# Patient Record
Sex: Female | Born: 1935 | Race: White | Hispanic: No | Smoking: Never smoker
Health system: Southern US, Community
[De-identification: ages and names within clinical notes are randomized; demographics above are authoritative.]

## PROBLEM LIST (undated history)

## (undated) DIAGNOSIS — D649 Anemia, unspecified: Secondary | ICD-10-CM

## (undated) DIAGNOSIS — I10 Essential (primary) hypertension: Secondary | ICD-10-CM

## (undated) DIAGNOSIS — H409 Unspecified glaucoma: Secondary | ICD-10-CM

## (undated) DIAGNOSIS — S8011XA Contusion of right lower leg, initial encounter: Secondary | ICD-10-CM

## (undated) DIAGNOSIS — L03115 Cellulitis of right lower limb: Secondary | ICD-10-CM

## (undated) DIAGNOSIS — H547 Unspecified visual loss: Secondary | ICD-10-CM

## (undated) DIAGNOSIS — R531 Weakness: Secondary | ICD-10-CM

## (undated) HISTORY — PX: CYST REMOVAL NECK: SHX6281

## (undated) HISTORY — DX: Unspecified visual loss: H54.7

## (undated) HISTORY — DX: Weakness: R53.1

## (undated) HISTORY — DX: Essential (primary) hypertension: I10

## (undated) HISTORY — DX: Cellulitis of right lower limb: L03.115

## (undated) HISTORY — DX: Unspecified glaucoma: H40.9

## (undated) HISTORY — DX: Contusion of right lower leg, initial encounter: S80.11XA

## (undated) HISTORY — DX: Anemia, unspecified: D64.9

---

## 2015-02-12 ENCOUNTER — Inpatient Hospital Stay (HOSPITAL_COMMUNITY)
Admission: EM | Admit: 2015-02-12 | Discharge: 2015-02-16 | DRG: 603 | Disposition: A | Discharge status: Skilled Nursing Facility | Payer: Medicare Other | Attending: Internal Medicine | Admitting: Internal Medicine

## 2015-02-12 ENCOUNTER — Emergency Department (HOSPITAL_COMMUNITY): Payer: Medicare Other

## 2015-02-12 ENCOUNTER — Encounter (HOSPITAL_COMMUNITY): Payer: Self-pay | Admitting: Emergency Medicine

## 2015-02-12 DIAGNOSIS — I10 Essential (primary) hypertension: Secondary | ICD-10-CM | POA: Diagnosis present

## 2015-02-12 DIAGNOSIS — Z79899 Other long term (current) drug therapy: Secondary | ICD-10-CM

## 2015-02-12 DIAGNOSIS — E871 Hypo-osmolality and hyponatremia: Secondary | ICD-10-CM | POA: Diagnosis present

## 2015-02-12 DIAGNOSIS — I878 Other specified disorders of veins: Secondary | ICD-10-CM | POA: Diagnosis present

## 2015-02-12 DIAGNOSIS — S81801A Unspecified open wound, right lower leg, initial encounter: Secondary | ICD-10-CM | POA: Diagnosis present

## 2015-02-12 DIAGNOSIS — H409 Unspecified glaucoma: Secondary | ICD-10-CM | POA: Diagnosis present

## 2015-02-12 DIAGNOSIS — H540 Blindness, both eyes: Secondary | ICD-10-CM | POA: Diagnosis present

## 2015-02-12 DIAGNOSIS — L03115 Cellulitis of right lower limb: Principal | ICD-10-CM

## 2015-02-12 DIAGNOSIS — L0390 Cellulitis, unspecified: Secondary | ICD-10-CM

## 2015-02-12 DIAGNOSIS — E876 Hypokalemia: Secondary | ICD-10-CM | POA: Diagnosis present

## 2015-02-12 DIAGNOSIS — R609 Edema, unspecified: Secondary | ICD-10-CM

## 2015-02-12 DIAGNOSIS — D649 Anemia, unspecified: Secondary | ICD-10-CM | POA: Diagnosis present

## 2015-02-12 DIAGNOSIS — Z66 Do not resuscitate: Secondary | ICD-10-CM | POA: Diagnosis present

## 2015-02-12 DIAGNOSIS — W19XXXA Unspecified fall, initial encounter: Secondary | ICD-10-CM | POA: Diagnosis present

## 2015-02-12 LAB — COMPREHENSIVE METABOLIC PANEL
ALT: 18 U/L (ref 14–54)
AST: 20 U/L (ref 15–41)
Albumin: 3.6 g/dL (ref 3.5–5.0)
Alkaline Phosphatase: 61 U/L (ref 38–126)
Anion gap: 11 (ref 5–15)
BILIRUBIN TOTAL: 0.5 mg/dL (ref 0.3–1.2)
BUN: 25 mg/dL — AB (ref 6–20)
CO2: 26 mmol/L (ref 22–32)
CREATININE: 0.62 mg/dL (ref 0.44–1.00)
Calcium: 9.4 mg/dL (ref 8.9–10.3)
Chloride: 97 mmol/L — ABNORMAL LOW (ref 101–111)
Glucose, Bld: 133 mg/dL — ABNORMAL HIGH (ref 65–99)
Potassium: 4 mmol/L (ref 3.5–5.1)
Sodium: 134 mmol/L — ABNORMAL LOW (ref 135–145)
TOTAL PROTEIN: 6.7 g/dL (ref 6.5–8.1)

## 2015-02-12 LAB — CBC WITH DIFFERENTIAL/PLATELET
BASOS ABS: 0 10*3/uL (ref 0.0–0.1)
BASOS PCT: 0 %
EOS ABS: 0 10*3/uL (ref 0.0–0.7)
EOS PCT: 0 %
HCT: 36.9 % (ref 36.0–46.0)
Hemoglobin: 11.8 g/dL — ABNORMAL LOW (ref 12.0–15.0)
Lymphocytes Relative: 16 %
Lymphs Abs: 1.3 10*3/uL (ref 0.7–4.0)
MCH: 29.1 pg (ref 26.0–34.0)
MCHC: 32 g/dL (ref 30.0–36.0)
MCV: 90.9 fL (ref 78.0–100.0)
Monocytes Absolute: 0.9 10*3/uL (ref 0.1–1.0)
Monocytes Relative: 11 %
NEUTROS PCT: 73 %
Neutro Abs: 6 10*3/uL (ref 1.7–7.7)
PLATELETS: 230 10*3/uL (ref 150–400)
RBC: 4.06 MIL/uL (ref 3.87–5.11)
RDW: 13.8 % (ref 11.5–15.5)
WBC: 8.2 10*3/uL (ref 4.0–10.5)

## 2015-02-12 LAB — I-STAT CG4 LACTIC ACID, ED: LACTIC ACID, VENOUS: 0.97 mmol/L (ref 0.5–2.0)

## 2015-02-12 NOTE — ED Notes (Signed)
Pt c/o painless right lower leg bruising, edema, cellulitis, ecchymotic lumps. Onset unknown due to patient being blind and leg being painless. Open wound present to leg, has been present for weeks.

## 2015-02-12 NOTE — ED Provider Notes (Signed)
CSN: [id number]     Arrival date & time 02/12/15  1812 History   First MD Initiated Contact with Patient 02/12/15 2250     Chief Complaint  Patient presents with  . Cellulitis     (Consider location/radiation/quality/duration/timing/severity/associated sxs/prior Treatment) HPI   Chelsea Hanson is a 82 y.o. female with PMH significant for HTN and sight impairment who presents with painless RLL skin infection sent from PCP office.  Hx provided by caregiver/family member and the patient.  Patient is unable to report when she noticed her symptoms, but states that it was not there in November at her check up with her PCP.  She denies fever, chills, CP, SOB, N/V, abdominal pain, or urinary symptoms.  She is not on anticoagulants.  No injury/trauma.   Past Medical History  Diagnosis Date  . Hypertension    History reviewed. No pertinent past surgical history. No family history on file. Social History  Substance Use Topics  . Smoking status: None  . Smokeless tobacco: None  . Alcohol Use: No   OB History    No data available     Review of Systems  All other systems negative unless otherwise stated in HPI   Allergies  Codeine  Home Medications   Prior to Admission medications   Medication Sig Start Date End Date Taking? Authorizing Provider  amLODipine (NORVASC) 5 MG tablet Take 5 mg by mouth daily.   Yes Historical Provider, MD  Bioflavonoid Products (ESTER C PO) Take 2 tablets by mouth daily.   Yes Historical Provider, MD  brinzolamide (AZOPT) 1 % ophthalmic suspension Place 1 drop into both eyes 3 (three) times daily.   Yes Historical Provider, MD  chlorthalidone (HYGROTON) 25 MG tablet Take 25 mg by mouth daily.   Yes Historical Provider, MD   BP 133/57 mmHg  Pulse 76  Resp 18  SpO2 98% Physical Exam  Constitutional: She is oriented to person, place, and time. She appears well-developed and well-nourished.  HENT:  Head: Normocephalic and atraumatic.  Mouth/Throat:  Oropharynx is clear and moist.  Eyes: Conjunctivae are normal. Pupils are equal, round, and reactive to light.  Neck: Normal range of motion. Neck supple.  Cardiovascular: Normal rate, regular rhythm and normal heart sounds.   No murmur heard. 2+ pitting edema bilaterally in lower extremities at ankles. Capillary refill less than 3 seconds.   Pulmonary/Chest: Effort normal and breath sounds normal. No accessory muscle usage or stridor. No respiratory distress. She has no wheezes. She has no rhonchi. She has no rales.  Abdominal: Soft. Bowel sounds are normal. She exhibits no distension. There is no tenderness.  Musculoskeletal: Normal range of motion.  Lymphadenopathy:    She has no cervical adenopathy.  Neurological: She is alert and oriented to person, place, and time.  Speech clear without dysarthria.  Strength and sensation intact bilaterally throughout lower extremities.  Skin: Skin is warm and dry. There is erythema. No cyanosis.  Erythematous, non-tender, non-draining area over anterior and posterior right lower extremity.  Hemorrhagic bullae within erythematous lesion.  See photos below.  Psychiatric: She has a normal mood and affect. Her behavior is normal.        ED Course  Procedures (including critical care time) Labs Review Labs Reviewed  COMPREHENSIVE METABOLIC PANEL - Abnormal; Notable for the following:    Sodium 134 (*)    Chloride 97 (*)    Glucose, Bld 133 (*)    BUN 25 (*)    All other components within  normal limits  CBC WITH DIFFERENTIAL/PLATELET - Abnormal; Notable for the following:    Hemoglobin 11.8 (*)    All other components within normal limits  SEDIMENTATION RATE - Abnormal; Notable for the following:    Sed Rate 30 (*)    All other components within normal limits  WOUND CULTURE  C-REACTIVE PROTEIN  I-STAT CG4 LACTIC ACID, ED  I-STAT CG4 LACTIC ACID, ED    Imaging Review Dg Tibia/fibula Right  02/13/2015  CLINICAL DATA:  82 year old female  with skin infection and concern for osteomyelitis. EXAM: RIGHT TIBIA AND FIBULA - 2 VIEW COMPARISON:  None. FINDINGS: There is no acute fracture or dislocation. Mild osteopenia. There is no bone erosion or periosteal reaction. There is mild diffuse subcutaneous soft tissue stranding likely cellulitis. A focal area of skin thickening and irregularity noted at the anterior lateral aspect of the mid portion of the leg, likely the area of focal skin infection and wound. No soft tissue gas identified. IMPRESSION: Soft tissue swelling without definite evidence of acute osseous pathology. MRI or white blood cells scintigraphy may provide better evaluation if there is high clinical concern for osteomyelitis. Electronically Signed   By: Anner Crete M.D.   On: 02/13/2015 00:35   I have personally reviewed and evaluated these images and lab results as part of my medical decision-making.   EKG Interpretation None      MDM   Final diagnoses:  Cellulitis of right lower extremity  Cellulitis    Patient presents with symptoms consistent with cellulitis.  NVI.  Good capillary refill, doubt vascular etiology.  VSS, NAD.  Patient appears non-toxic or septic.  Lactic acid 0.97.  CMP unremarkable.  CBC unremarkable, HGB 11.8, WBC 8.2.  No evidence of sepsis at this time.  Concern for osteomyelitis.  Will obtain plain films of right lower extremity. Will also obtain ESR, CRP, and wound culture.   ESR elevated at 30.  CRP pending.  Plain films of tib/fib remarkable for soft tissue swelling without definite evidence of acute osseous pathology.  Will start IV vanc and zosyn and admit to medicine. Case has been discussed with and seen by Dr. Rex Kras who agrees with the above plan for admission.     Gloriann Loan, PA-C 02/13/15 0100  Sharlett Iles, MD 02/14/15 2031

## 2015-02-13 ENCOUNTER — Emergency Department (HOSPITAL_COMMUNITY): Payer: Medicare Other

## 2015-02-13 ENCOUNTER — Encounter (HOSPITAL_COMMUNITY): Payer: Self-pay

## 2015-02-13 ENCOUNTER — Inpatient Hospital Stay (HOSPITAL_COMMUNITY): Payer: Medicare Other

## 2015-02-13 DIAGNOSIS — L03115 Cellulitis of right lower limb: Secondary | ICD-10-CM | POA: Diagnosis present

## 2015-02-13 DIAGNOSIS — I878 Other specified disorders of veins: Secondary | ICD-10-CM | POA: Diagnosis present

## 2015-02-13 DIAGNOSIS — S81801A Unspecified open wound, right lower leg, initial encounter: Secondary | ICD-10-CM | POA: Diagnosis present

## 2015-02-13 DIAGNOSIS — H540 Blindness, both eyes: Secondary | ICD-10-CM | POA: Diagnosis present

## 2015-02-13 DIAGNOSIS — R609 Edema, unspecified: Secondary | ICD-10-CM

## 2015-02-13 DIAGNOSIS — W19XXXA Unspecified fall, initial encounter: Secondary | ICD-10-CM | POA: Diagnosis present

## 2015-02-13 DIAGNOSIS — I10 Essential (primary) hypertension: Secondary | ICD-10-CM | POA: Diagnosis present

## 2015-02-13 DIAGNOSIS — E876 Hypokalemia: Secondary | ICD-10-CM | POA: Diagnosis present

## 2015-02-13 DIAGNOSIS — L0390 Cellulitis, unspecified: Secondary | ICD-10-CM | POA: Diagnosis present

## 2015-02-13 DIAGNOSIS — E871 Hypo-osmolality and hyponatremia: Secondary | ICD-10-CM | POA: Diagnosis present

## 2015-02-13 DIAGNOSIS — D649 Anemia, unspecified: Secondary | ICD-10-CM | POA: Diagnosis present

## 2015-02-13 DIAGNOSIS — L02419 Cutaneous abscess of limb, unspecified: Secondary | ICD-10-CM | POA: Diagnosis not present

## 2015-02-13 DIAGNOSIS — Z79899 Other long term (current) drug therapy: Secondary | ICD-10-CM | POA: Diagnosis not present

## 2015-02-13 DIAGNOSIS — Z66 Do not resuscitate: Secondary | ICD-10-CM | POA: Diagnosis present

## 2015-02-13 DIAGNOSIS — H409 Unspecified glaucoma: Secondary | ICD-10-CM | POA: Diagnosis present

## 2015-02-13 LAB — CBC WITH DIFFERENTIAL/PLATELET
BASOS ABS: 0 10*3/uL (ref 0.0–0.1)
BASOS PCT: 0 %
EOS ABS: 0 10*3/uL (ref 0.0–0.7)
Eosinophils Relative: 0 %
HEMATOCRIT: 32.9 % — AB (ref 36.0–46.0)
HEMOGLOBIN: 10.6 g/dL — AB (ref 12.0–15.0)
Lymphocytes Relative: 19 %
Lymphs Abs: 1.2 10*3/uL (ref 0.7–4.0)
MCH: 29.4 pg (ref 26.0–34.0)
MCHC: 32.2 g/dL (ref 30.0–36.0)
MCV: 91.1 fL (ref 78.0–100.0)
Monocytes Absolute: 0.9 10*3/uL (ref 0.1–1.0)
Monocytes Relative: 14 %
NEUTROS ABS: 4.2 10*3/uL (ref 1.7–7.7)
NEUTROS PCT: 67 %
Platelets: 195 10*3/uL (ref 150–400)
RBC: 3.61 MIL/uL — AB (ref 3.87–5.11)
RDW: 13.8 % (ref 11.5–15.5)
WBC: 6.3 10*3/uL (ref 4.0–10.5)

## 2015-02-13 LAB — COMPREHENSIVE METABOLIC PANEL
ALK PHOS: 55 U/L (ref 38–126)
ALT: 16 U/L (ref 14–54)
ANION GAP: 11 (ref 5–15)
AST: 20 U/L (ref 15–41)
Albumin: 3 g/dL — ABNORMAL LOW (ref 3.5–5.0)
BUN: 17 mg/dL (ref 6–20)
CALCIUM: 8.5 mg/dL — AB (ref 8.9–10.3)
CO2: 28 mmol/L (ref 22–32)
CREATININE: 0.56 mg/dL (ref 0.44–1.00)
Chloride: 98 mmol/L — ABNORMAL LOW (ref 101–111)
Glucose, Bld: 101 mg/dL — ABNORMAL HIGH (ref 65–99)
Potassium: 3.1 mmol/L — ABNORMAL LOW (ref 3.5–5.1)
SODIUM: 137 mmol/L (ref 135–145)
TOTAL PROTEIN: 5.6 g/dL — AB (ref 6.5–8.1)
Total Bilirubin: 0.8 mg/dL (ref 0.3–1.2)

## 2015-02-13 LAB — C-REACTIVE PROTEIN: CRP: 1.4 mg/dL — AB (ref <1.0)

## 2015-02-13 LAB — SEDIMENTATION RATE: SED RATE: 30 mm/h — AB (ref 0–22)

## 2015-02-13 MED ORDER — ACETAMINOPHEN 325 MG PO TABS: 650.0000 mg | ORAL_TABLET | Freq: Four times a day (QID) | ORAL | Status: DC | PRN

## 2015-02-13 MED ORDER — ONDANSETRON HCL 4 MG PO TABS: 4.0000 mg | ORAL_TABLET | Freq: Four times a day (QID) | ORAL | Status: DC | PRN

## 2015-02-13 MED ORDER — BRINZOLAMIDE 1 % OP SUSP
1.0000 [drp] | Freq: Three times a day (TID) | OPHTHALMIC | Status: DC
  Administered 2015-02-13 – 2015-02-16 (×7): 1 [drp] via OPHTHALMIC
  Filled 2015-02-13: qty 10

## 2015-02-13 MED ORDER — SODIUM CHLORIDE 0.9 % IV SOLN
INTRAVENOUS | Status: DC
  Administered 2015-02-13: 04:00:00 via INTRAVENOUS

## 2015-02-13 MED ORDER — POTASSIUM CHLORIDE CRYS ER 20 MEQ PO TBCR
40.0000 meq | EXTENDED_RELEASE_TABLET | Freq: Once | ORAL | Status: AC
  Administered 2015-02-13: 40 meq via ORAL
  Filled 2015-02-13: qty 2

## 2015-02-13 MED ORDER — CHLORTHALIDONE 25 MG PO TABS
25.0000 mg | ORAL_TABLET | Freq: Every day | ORAL | Status: DC
  Administered 2015-02-13 – 2015-02-14 (×2): 25 mg via ORAL
  Filled 2015-02-13 (×2): qty 1

## 2015-02-13 MED ORDER — SODIUM CHLORIDE 0.9 % IV BOLUS (SEPSIS)
500.0000 mL | Freq: Once | INTRAVENOUS | Status: AC
  Administered 2015-02-13: 500 mL via INTRAVENOUS

## 2015-02-13 MED ORDER — AMLODIPINE BESYLATE 5 MG PO TABS
5.0000 mg | ORAL_TABLET | Freq: Every day | ORAL | Status: DC
  Administered 2015-02-13: 5 mg via ORAL
  Filled 2015-02-13 (×2): qty 1

## 2015-02-13 MED ORDER — PIPERACILLIN-TAZOBACTAM 3.375 G IVPB
3.3750 g | Freq: Three times a day (TID) | INTRAVENOUS | Status: DC
  Administered 2015-02-13 – 2015-02-15 (×7): 3.375 g via INTRAVENOUS
  Filled 2015-02-13 (×7): qty 50

## 2015-02-13 MED ORDER — PIPERACILLIN-TAZOBACTAM 3.375 G IVPB
3.3750 g | Freq: Once | INTRAVENOUS | Status: AC
  Administered 2015-02-13: 3.375 g via INTRAVENOUS
  Filled 2015-02-13: qty 50

## 2015-02-13 MED ORDER — ONDANSETRON HCL 4 MG/2ML IJ SOLN: 4.0000 mg | Freq: Four times a day (QID) | INTRAMUSCULAR | Status: DC | PRN

## 2015-02-13 MED ORDER — IOHEXOL 300 MG/ML  SOLN
100.0000 mL | Freq: Once | INTRAMUSCULAR | Status: AC | PRN
  Administered 2015-02-13: 100 mL via INTRAVENOUS

## 2015-02-13 MED ORDER — ACETAMINOPHEN 650 MG RE SUPP: 650.0000 mg | Freq: Four times a day (QID) | RECTAL | Status: DC | PRN

## 2015-02-13 MED ORDER — VANCOMYCIN HCL IN DEXTROSE 1-5 GM/200ML-% IV SOLN
1000.0000 mg | INTRAVENOUS | Status: DC
  Administered 2015-02-13 – 2015-02-15 (×3): 1000 mg via INTRAVENOUS
  Filled 2015-02-13 (×3): qty 200

## 2015-02-13 NOTE — Progress Notes (Signed)
Patient took blood pressure meds out of her purse. Instructed patient not to do this and ask family to take meds home. Patient verbalized understanding. Sharrell Ku RN

## 2015-02-13 NOTE — Progress Notes (Signed)
Reviewed wound care note.  Consider orthopedic consult in the AM.   Debe Coder, MD [phone]

## 2015-02-13 NOTE — Consult Note (Signed)
WOC wound consult note Reason for Consult:INjury to left LE at the anterior and lateral aspects. Wound type:Trauma Pressure Ulcer POA: No Measurement:Anterior hematoma measures 14cm x 8cm and is mildly elevated.  Lateral LE with hematoma measuring 7cm x 6cm with an open full thickness wound embedded measuring 3cm x 2.5cm x 0.4cm Wound WUJ:WJXBJ red, moist, draining thin serosanguinous fluid in small amounts at this time Drainage (amount, consistency, odor)See above Periwound: ecchymotic, erythematous.  Dressing procedure/placement/frequency: Management of the hematomas and wound today will be conservative; I have implemented twice daily saline dressings and floatation of her heels off of the bed to avoid the incidence of pressure ulceration.  If you agree, a consultation with [facility] or orthopedics to determine is further care of the hematomas is required would be one POC.  The evaluation and management of hematomas is slightly outside the scope of WOC Nursing. WOC nursing team will not follow, but will remain available to this patient, the nursing and medical teams.  Please re-consult if needed. Thanks, Ladona Mow, MSN, RN, GNP, [facility]  Pager# [phone]

## 2015-02-13 NOTE — Progress Notes (Addendum)
Brief [facility] Note  Saw patient, confirmed history and physical exam.  Patients RLE with impressive redness, swelling and warmth compared to left.  Also with large bruising and likely hematoma.  She is blind and did not notice, it has not caused her any pain.  She did have a fall a while back and hit that leg which may explain the bruising.   Plan  Wound/Cellulitis - Wound care - Abx with zosyn/vanco.  She is eating, so she may be able to transition to oral meds quickly such as doxycycline depending on healing course - LE ultrasound ordered  HTN - Home meds of amlodipine, chlorthalidone ordered.   Hypokalemia - K 3.1, ordered oral K supplementation.   Consider PT/OT once acute issues are better resolved and above studies are done.   Debe Coder, MD [phone]

## 2015-02-13 NOTE — ED Notes (Signed)
Patient transported to CT 

## 2015-02-13 NOTE — Progress Notes (Signed)
ANTIBIOTIC CONSULT NOTE - INITIAL  Pharmacy Consult for Zosyn/Vancomycin Indication: Cellulitis  Allergies  Allergen Reactions  . Codeine Other (See Comments)    Makes her crazy.    Patient Measurements: Height:  (157.5 cm) Weight: 150 lb (68.04 kg) IBW/kg (Calculated) : 50.1   Vital Signs: BP: 133/57 mmHg (01/16 2308) Pulse Rate: 76 (01/16 2308) Intake/Output from previous day:   Intake/Output from this shift:    Labs:  Recent Labs  02/12/15 1948  WBC 8.2  HGB 11.8*  PLT 230  CREATININE 0.62   Estimated Creatinine Clearance: 40.6 mL/min (by C-G formula based on Cr of 0.62). No results for input(s): VANCOTROUGH, VANCOPEAK, VANCORANDOM, GENTTROUGH, GENTPEAK, GENTRANDOM, TOBRATROUGH, TOBRAPEAK, TOBRARND, AMIKACINPEAK, AMIKACINTROU, AMIKACIN in the last 72 hours.   Microbiology: No results found for this or any previous visit (from the past 720 hour(s)).  Medical History: Past Medical History  Diagnosis Date  . Hypertension     Medications:   (Not in a hospital admission) Scheduled:   Infusions:  . piperacillin-tazobactam (ZOSYN)  IV 3.375 g (02/13/15 0111)  . vancomycin     Assessment: 89 yoF presents with LE bruising, edema and open wound.  Zosyn and Vancomycin per Rx for Cellulitis.   Goal of Therapy:  Vancomycin trough level 15-20 mcg/ml  Plan:   Zosyn 3.375 Gm IV q8h EI  Vancomycin 1Gm IV q24h  F/u SCr/cultures/levels as needed  Susanne Greenhouse R 02/13/2015,1:27 AM

## 2015-02-13 NOTE — H&P (Addendum)
[facility] History and Physical  Chelsea Hanson WUJ:[id number] DOB: [birth year] DOA: 02/12/2015  Referring physician: Ms. Okey Dupre.  PCP: Eldridge Abrahams, MD  Specialists: None.  Chief Complaint: Right lower extremity swelling and erythema.  HPI: Chelsea Hanson is a 82 y.o. female with history of blindness and glaucoma and hypertension was brought to the ER after patient's daughter noticed erythema and swelling of right lower extremity with blebs on routine visit. Patient is blind and has not noticed these changes. Patient states around Christmas time 3 weeks ago patient had a fall and may have hurt her leg. Patient denies any pain. On exam patient's right lower extremity is erythematous swollen with blebs on the lateral aspect of her right lower extremity extending from right knee to the foot. There is some wounds which are not having any active discharge. CT of the leg does not show any deep abscesses or bony involvement. Patient has been admitted for possible cellulitis with wounds and possible superficial hematoma.   Review of Systems: As presented in the history of presenting illness, rest negative.  Past Medical History  Diagnosis Date  . Hypertension    Past Surgical History  Procedure Laterality Date  . Cyst removal neck     Social History:  reports that she has never smoked. She does not have any smokeless tobacco history on file. She reports that she does not drink alcohol or use illicit drugs. Where does patient live - [place]. Can patient participate in ADLs? Yes.  Allergies  Allergen Reactions  . Codeine Other (See Comments)    Makes her crazy.    Family History:  Family History  Problem Relation Age of Onset  . CAD Father       Prior to Admission medications   Medication Sig Start Date End Date Taking? Authorizing Provider  amLODipine (NORVASC) 5 MG tablet Take 5 mg by mouth daily.   Yes Historical Provider, MD  Bioflavonoid Products (ESTER C PO)  Take 2 tablets by mouth daily.   Yes Historical Provider, MD  brinzolamide (AZOPT) 1 % ophthalmic suspension Place 1 drop into both eyes 3 (three) times daily.   Yes Historical Provider, MD  chlorthalidone (HYGROTON) 25 MG tablet Take 25 mg by mouth daily.   Yes Historical Provider, MD    Physical Exam: Filed Vitals:   02/12/15 2308 02/13/15 0107 02/13/15 0133 02/13/15 0240  BP: 133/57  145/59 143/68  Pulse: 76  76 90  Temp:   97.6 F (36.4 C) 97.5 F (36.4 C)  TempSrc:   Oral Oral  Resp: Height:   (1.575 m)   (1.575 m)  Weight:  68.04 kg (150 lb)  70.58 kg (155 lb 9.6 oz)  SpO2: 98%  98% 97%     General:  Moderately built and nourished.  Eyes: Anicteric no pallor.  ENT: No discharge from the ears eyes nose or mouth.  Neck: No mass felt.  Cardiovascular: S1 and S2 heard.  Respiratory: No rhonchi or crepitations.  Abdomen: Soft nontender bowel sounds present.  Skin: Erythema extending from the right knee up to the foot mostly on the lateral aspect with blebs. There is couple of ulcerated areas. No active discharge.  Musculoskeletal: Right lower extremity is swollen and see skin section.  Psychiatric: Appears normal.  Neurologic: Alert awake oriented to time place and person. Moves all extremities.  Labs on Admission:  Basic Metabolic Panel:  Recent Labs Lab 02/12/15 1948  NA 134*  K  4.0  CL 97*  CO2 26  GLUCOSE 133*  BUN 25*  CREATININE 0.62  CALCIUM 9.4   Liver Function Tests:  Recent Labs Lab 02/12/15 1948  AST 20  ALT 18  ALKPHOS 61  BILITOT 0.5  PROT 6.7  ALBUMIN 3.6   No results for input(s): LIPASE, AMYLASE in the last 168 hours. No results for input(s): AMMONIA in the last 168 hours. CBC:  Recent Labs Lab 02/12/15 1948  WBC 8.2  NEUTROABS 6.0  HGB 11.8*  HCT 36.9  MCV 90.9  PLT 230   Cardiac Enzymes: No results for input(s): CKTOTAL, CKMB, CKMBINDEX, TROPONINI in the last 168 hours.  BNP (last 3  results) No results for input(s): BNP in the last 8760 hours.  ProBNP (last 3 results) No results for input(s): PROBNP in the last 8760 hours.  CBG: No results for input(s): GLUCAP in the last 168 hours.  Radiological Exams on Admission: Dg Tibia/fibula Right  02/13/2015  CLINICAL DATA:  82 year old female with skin infection and concern for osteomyelitis. EXAM: RIGHT TIBIA AND FIBULA - 2 VIEW COMPARISON:  None. FINDINGS: There is no acute fracture or dislocation. Mild osteopenia. There is no bone erosion or periosteal reaction. There is mild diffuse subcutaneous soft tissue stranding likely cellulitis. A focal area of skin thickening and irregularity noted at the anterior lateral aspect of the mid portion of the leg, likely the area of focal skin infection and wound. No soft tissue gas identified. IMPRESSION: Soft tissue swelling without definite evidence of acute osseous pathology. MRI or white blood cells scintigraphy may provide better evaluation if there is high clinical concern for osteomyelitis. Electronically Signed   By: Elgie Collard M.D.   On: 02/13/2015 00:35   Ct Tibia Fibula Right W Contrast  02/13/2015  CLINICAL DATA:  Swelling and discoloration along the right lower leg, of indeterminate age. Initial encounter. EXAM: CT OF THE LOWER RIGHT EXTREMITY WITH CONTRAST TECHNIQUE: Multidetector CT imaging of the right tibia and fibula was performed according to the standard protocol following intravenous contrast administration. COMPARISON:  Right tibia/fibula radiographs performed earlier today at 12:26 a.m. CONTRAST:  OMNIPAQUE IOHEXOL 300 MG/ML  SOLN FINDINGS: The tibia and fibula appear intact. There is no evidence of fracture or dislocation. Mild diffuse soft tissue edema is noted tracking along the right leg, more prominent distally, with diffuse skin thickening noted about the level of the lower leg and ankle. The underlying musculature is grossly unremarkable. The visualized  vasculature appears intact, aside from scattered calcification along the popliteal artery and its branches. There is no evidence of abscess. No knee joint effusion is identified. Visualized soft tissue structures about the knee are grossly unremarkable. IMPRESSION: 1. No evidence of fracture or dislocation. 2. Mild diffuse soft tissue edema tracks along the right leg, more prominent distally, with diffuse skin thickening about the level of the lower leg and ankle. 3. No evidence of abscess. 4. Mild scattered vascular calcifications seen. Electronically Signed   By: Roanna Raider M.D.   On: 02/13/2015 02:13     Assessment/Plan Active Problems:   Cellulitis   Essential hypertension   Cellulitis of right lower extremity   1. Right lower extremity cellulitis with possible superficial hematoma - I have placed patient on empiric antibiotics for cellulitis. Will check Dopplers to rule out DVT. Wound team consult. 2. Hypertension - continue home medications. 3. Normocytic anemia - no old labs to compare. Follow CBC. 4. 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  DVT Prophylaxis SCDs since patient has possible hematoma in the right leg.  Code Status: DO NOT RESUSCITATE.  Family Communication: Patient's daughter.  Disposition Plan: Admit to inpatient.    Livio Ledwith N. [facility] Pager [phone].  If 7PM-7AM, please contact night-coverage [email or web address] Password [email or web address] 02/13/2015, 3:48 AM

## 2015-02-13 NOTE — Progress Notes (Signed)
*  Preliminary Results* Right lower extremity venous duplex completed. Study was technically difficult due to edema and poor patient cooperation. Right lower extremity is negative for deep vein thrombosis. There is no evidence of right Baker's cyst.  02/13/2015 9:51 AM  Gertie Fey, RVT, RDCS, RDMS

## 2015-02-14 ENCOUNTER — Encounter (HOSPITAL_COMMUNITY): Payer: Self-pay | Admitting: Internal Medicine

## 2015-02-14 DIAGNOSIS — L03115 Cellulitis of right lower limb: Principal | ICD-10-CM

## 2015-02-14 DIAGNOSIS — E876 Hypokalemia: Secondary | ICD-10-CM | POA: Diagnosis present

## 2015-02-14 DIAGNOSIS — D649 Anemia, unspecified: Secondary | ICD-10-CM | POA: Diagnosis present

## 2015-02-14 DIAGNOSIS — I10 Essential (primary) hypertension: Secondary | ICD-10-CM

## 2015-02-14 LAB — BASIC METABOLIC PANEL
ANION GAP: 9 (ref 5–15)
BUN: 16 mg/dL (ref 6–20)
CALCIUM: 8.5 mg/dL — AB (ref 8.9–10.3)
CO2: 26 mmol/L (ref 22–32)
Chloride: 99 mmol/L — ABNORMAL LOW (ref 101–111)
Creatinine, Ser: 0.75 mg/dL (ref 0.44–1.00)
GFR calc Af Amer: >60 mL/min (ref >60)
GLUCOSE: 114 mg/dL — AB (ref 65–99)
POTASSIUM: 3.8 mmol/L (ref 3.5–5.1)
SODIUM: 134 mmol/L — AB (ref 135–145)

## 2015-02-14 MED ORDER — CHLORTHALIDONE 25 MG PO TABS
25.0000 mg | ORAL_TABLET | Freq: Every day | ORAL | Status: DC
Start: 2015-02-15 — End: 2015-02-16
  Administered 2015-02-15 – 2015-02-16 (×2): 25 mg via ORAL
  Filled 2015-02-14 (×3): qty 1

## 2015-02-14 MED ORDER — AMLODIPINE BESYLATE 5 MG PO TABS
5.0000 mg | ORAL_TABLET | Freq: Every day | ORAL | Status: DC
  Administered 2015-02-14: 5 mg via ORAL
  Filled 2015-02-14: qty 1

## 2015-02-14 MED ORDER — AMLODIPINE BESYLATE 5 MG PO TABS
5.0000 mg | ORAL_TABLET | Freq: Every day | ORAL | Status: DC
  Administered 2015-02-15 – 2015-02-16 (×2): 5 mg via ORAL
  Filled 2015-02-14 (×3): qty 1

## 2015-02-14 NOTE — Consult Note (Signed)
Reason for Consult:  Right leg wound with cellulitis Referring Physician:   [facility]  Chelsea Hanson is an 82 y.o. female.  HPI:   82 yo female recently hospitalized with what is felt to be cellulitis of her right leg.  Started on IV antibiotics.  X-rays negative for fracture or abscess.  She denies any significant right leg pain.  She does report that she bruises easily and did likely sustain a contusion to that leg.  Ortho is consulted to assess her wound and make any recommendations for treatment.  History reviewed. No pertinent past medical history.  Past Surgical History  Procedure Laterality Date  . Cyst removal neck      Family History  Problem Relation Age of Onset  . CAD Father     Social History:  reports that she has never smoked. She does not have any smokeless tobacco history on file. She reports that she does not drink alcohol or use illicit drugs.  Allergies:  Allergies  Allergen Reactions  . Codeine Other (See Comments)    Makes her crazy.    Medications: I have reviewed the patient's current medications.  Results for orders placed or performed during the hospital encounter of 02/12/15 (from the past 48 hour(s))  Comprehensive metabolic panel     Status: Abnormal   Collection Time: 02/12/15  7:48 PM  Result Value Ref Range   Sodium 134 (L) 135 - 145 mmol/L   Potassium 4.0 3.5 - 5.1 mmol/L   Chloride 97 (L) 101 - 111 mmol/L   CO2 26 22 - 32 mmol/L   Glucose, Bld 133 (H) 65 - 99 mg/dL   BUN 25 (H) 6 - 20 mg/dL   Creatinine, Ser 0.62 0.44 - 1.00 mg/dL   Calcium 9.4 8.9 - 10.3 mg/dL   Total Protein 6.7 6.5 - 8.1 g/dL   Albumin 3.6 3.5 - 5.0 g/dL   AST 20 15 - 41 U/L   ALT 18 14 - 54 U/L   Alkaline Phosphatase 61 38 - 126 U/L   Total Bilirubin 0.5 0.3 - 1.2 mg/dL   GFR calc non Af Amer >60 >60 mL/min   GFR calc Af Amer >60 >60 mL/min    Comment: (NOTE) The eGFR has been calculated using the CKD EPI equation. This calculation has not been validated  in all clinical situations. eGFR's persistently <60 mL/min signify possible Chronic Kidney Disease.    Anion gap 11 5 - 15  CBC with Differential     Status: Abnormal   Collection Time: 02/12/15  7:48 PM  Result Value Ref Range   WBC 8.2 4.0 - 10.5 K/uL   RBC 4.06 3.87 - 5.11 MIL/uL   Hemoglobin 11.8 (L) 12.0 - 15.0 g/dL   HCT 36.9 36.0 - 46.0 %   MCV 90.9 78.0 - 100.0 fL   MCH 29.1 26.0 - 34.0 pg   MCHC 32.0 30.0 - 36.0 g/dL   RDW 13.8 11.5 - 15.5 %   Platelets 230 150 - 400 K/uL   Neutrophils Relative % 73 %   Neutro Abs 6.0 1.7 - 7.7 K/uL   Lymphocytes Relative 16 %   Lymphs Abs 1.3 0.7 - 4.0 K/uL   Monocytes Relative 11 %   Monocytes Absolute 0.9 0.1 - 1.0 K/uL   Eosinophils Relative 0 %   Eosinophils Absolute 0.0 0.0 - 0.7 K/uL   Basophils Relative 0 %   Basophils Absolute 0.0 0.0 - 0.1 K/uL  Sedimentation rate  Status: Abnormal   Collection Time: 02/12/15  7:48 PM  Result Value Ref Range   Sed Rate 30 (H) 0 - 22 mm/hr  C-reactive protein     Status: Abnormal   Collection Time: 02/12/15  7:48 PM  Result Value Ref Range   CRP 1.4 (H) <1.0 mg/dL    Comment: Performed at [facility] Lactic Acid, ED (Not at [facility])     Status: None   Collection Time: 02/12/15  8:05 PM  Result Value Ref Range   Lactic Acid, Venous 0.97 0.5 - 2.0 mmol/L  Comprehensive metabolic panel     Status: Abnormal   Collection Time: 02/13/15  5:15 AM  Result Value Ref Range   Sodium 137 135 - 145 mmol/L   Potassium 3.1 (L) 3.5 - 5.1 mmol/L    Comment: DELTA CHECK NOTED REPEATED TO VERIFY    Chloride 98 (L) 101 - 111 mmol/L   CO2 28 22 - 32 mmol/L   Glucose, Bld 101 (H) 65 - 99 mg/dL   BUN 17 6 - 20 mg/dL   Creatinine, Ser 0.56 0.44 - 1.00 mg/dL   Calcium 8.5 (L) 8.9 - 10.3 mg/dL   Total Protein 5.6 (L) 6.5 - 8.1 g/dL   Albumin 3.0 (L) 3.5 - 5.0 g/dL   AST 20 15 - 41 U/L   ALT 16 14 - 54 U/L   Alkaline Phosphatase 55 38 - 126 U/L   Total Bilirubin 0.8 0.3 - 1.2  mg/dL   GFR calc non Af Amer >60 >60 mL/min   GFR calc Af Amer >60 >60 mL/min    Comment: (NOTE) The eGFR has been calculated using the CKD EPI equation. This calculation has not been validated in all clinical situations. eGFR's persistently <60 mL/min signify possible Chronic Kidney Disease.    Anion gap 11 5 - 15  CBC WITH DIFFERENTIAL     Status: Abnormal   Collection Time: 02/13/15  5:15 AM  Result Value Ref Range   WBC 6.3 4.0 - 10.5 K/uL   RBC 3.61 (L) 3.87 - 5.11 MIL/uL   Hemoglobin 10.6 (L) 12.0 - 15.0 g/dL   HCT 32.9 (L) 36.0 - 46.0 %   MCV 91.1 78.0 - 100.0 fL   MCH 29.4 26.0 - 34.0 pg   MCHC 32.2 30.0 - 36.0 g/dL   RDW 13.8 11.5 - 15.5 %   Platelets 195 150 - 400 K/uL   Neutrophils Relative % 67 %   Neutro Abs 4.2 1.7 - 7.7 K/uL   Lymphocytes Relative 19 %   Lymphs Abs 1.2 0.7 - 4.0 K/uL   Monocytes Relative 14 %   Monocytes Absolute 0.9 0.1 - 1.0 K/uL   Eosinophils Relative 0 %   Eosinophils Absolute 0.0 0.0 - 0.7 K/uL   Basophils Relative 0 %   Basophils Absolute 0.0 0.0 - 0.1 K/uL  Basic metabolic panel     Status: Abnormal   Collection Time: 02/14/15  4:26 AM  Result Value Ref Range   Sodium 134 (L) 135 - 145 mmol/L   Potassium 3.8 3.5 - 5.1 mmol/L    Comment: DELTA CHECK NOTED REPEATED TO VERIFY NO VISIBLE HEMOLYSIS    Chloride 99 (L) 101 - 111 mmol/L   CO2 26 22 - 32 mmol/L   Glucose, Bld 114 (H) 65 - 99 mg/dL   BUN 16 6 - 20 mg/dL   Creatinine, Ser 0.75 0.44 - 1.00 mg/dL   Calcium 8.5 (L) 8.9 -  10.3 mg/dL   GFR calc non Af Amer >60 >60 mL/min   GFR calc Af Amer >60 >60 mL/min    Comment: (NOTE) The eGFR has been calculated using the CKD EPI equation. This calculation has not been validated in all clinical situations. eGFR's persistently <60 mL/min signify possible Chronic Kidney Disease.    Anion gap 9 5 - 15    Dg Tibia/fibula Right  02/13/2015  CLINICAL DATA:  82 year old female with skin infection and concern for osteomyelitis. EXAM:  RIGHT TIBIA AND FIBULA - 2 VIEW COMPARISON:  None. FINDINGS: There is no acute fracture or dislocation. Mild osteopenia. There is no bone erosion or periosteal reaction. There is mild diffuse subcutaneous soft tissue stranding likely cellulitis. A focal area of skin thickening and irregularity noted at the anterior lateral aspect of the mid portion of the leg, likely the area of focal skin infection and wound. No soft tissue gas identified. IMPRESSION: Soft tissue swelling without definite evidence of acute osseous pathology. MRI or white blood cells scintigraphy may provide better evaluation if there is high clinical concern for osteomyelitis. Electronically Signed   By: Anner Crete M.D.   On: 02/13/2015 00:35   Ct Tibia Fibula Right W Contrast  02/13/2015  CLINICAL DATA:  Swelling and discoloration along the right lower leg, of indeterminate age. Initial encounter. EXAM: CT OF THE LOWER RIGHT EXTREMITY WITH CONTRAST TECHNIQUE: Multidetector CT imaging of the right tibia and fibula was performed according to the standard protocol following intravenous contrast administration. COMPARISON:  Right tibia/fibula radiographs performed earlier today at 12:26 a.m. CONTRAST:  138m OMNIPAQUE IOHEXOL 300 MG/ML  SOLN FINDINGS: The tibia and fibula appear intact. There is no evidence of fracture or dislocation. Mild diffuse soft tissue edema is noted tracking along the right leg, more prominent distally, with diffuse skin thickening noted about the level of the lower leg and ankle. The underlying musculature is grossly unremarkable. The visualized vasculature appears intact, aside from scattered calcification along the popliteal artery and its branches. There is no evidence of abscess. No knee joint effusion is identified. Visualized soft tissue structures about the knee are grossly unremarkable. IMPRESSION: 1. No evidence of fracture or dislocation. 2. Mild diffuse soft tissue edema tracks along the right leg, more  prominent distally, with diffuse skin thickening about the level of the lower leg and ankle. 3. No evidence of abscess. 4. Mild scattered vascular calcifications seen. Electronically Signed   By: JGarald BaldingM.D.   On: 02/13/2015 02:13    ROS Blood pressure 150/66, pulse 69, temperature 98.3 F (36.8 C), temperature source Oral, resp. rate 16, height 5' 2"  (1.575 m), weight 70.58 kg (155 lb 9.6 oz), SpO2 97 %. Physical Exam  Constitutional: She appears well-developed and well-nourished.  HENT:  Head: Normocephalic and atraumatic.  Neck: Normal range of motion. Neck supple.  Cardiovascular: Normal rate.   Respiratory: Effort normal and breath sounds normal.  GI: Soft. Bowel sounds are normal.  Musculoskeletal:       Legs:   Assessment/Plan: Right leg wound with hematoma and mild venous stasis changes. 1)  I was able to unroof some of her necrotic skin overlying hematomas in 2 places.  There was no evidence of infection.  I placed a large piece of Xeroform and a compressive wrap dressing.  Would leave this dressing on completely for the next 24-48 hours.  Will just need local wound care.  May consider a dynaflex wrap/splint.  BMcarthur Rossetti1/18/2017, 6:17 PM

## 2015-02-14 NOTE — Care Management Note (Addendum)
Case Management Note  Patient Details  Name: Chelsea Hanson MRN: [id number] Date of Birth: 03/12/1921  Subjective/Objective:                  Cellulitis/possible osteomylitis Action/Plan: Discharge planning  Expected Discharge Date:                  Expected Discharge Plan:     In-House Referral:     Discharge planning Services  CM Consult  Post Acute Care Choice:    Choice offered to:     DME Arranged:    DME Agency:     HH Arranged:    HH Agency:     Status of Service:     Medicare Important Message Given:    Date Medicare IM Given:    Medicare IM give by:    Date Additional Medicare IM Given:    Additional Medicare Important Message give by:     If discussed at Long Length of Stay Meetings, dates discussed:    Additional Comments: 14:00 Cm met with pt and son, Bryant in room.  Pt states she wishes to go home but is open to the idea of an interim care facility (SNF) if necessary to give her the best outcome.  CM discussed with CSW; we (CM and CSW waiting for ortho consult results and PT/OT eval results which will aid in determining the best discharge plan.  CM will continue to follow. 11:50 Cm met pt's son, bryant wilbourne [phone] who resides in [place],  in hallway as pt is indisposed at the moment.  Cm unable to share any medical information as pt has not yet given permission but son Bryant states pt lives alone in [place], is legally blind, adamantly has refused help to come in the past.  CM will meet with pt this afternoon to discuss pt's discharge plan. Jeffries, Sarah Christine, RN 02/14/2015, 11:50 AM  

## 2015-02-14 NOTE — Progress Notes (Signed)
Patient ID: Chelsea Hanson, female   DOB: Jul 16, 1921, 82 y.o.   MRN: [id number] [facility] PROGRESS NOTE  Kaelah Hayashi WUJ:[id number] DOB: [birth year] DOA: 02/12/2015 PCP: Eldridge Abrahams, MD  Brief narrative:    42 -year-old female with past medical history of glaucoma, blindness, hypertension who presented to [facility] long DD because of worsening redness and swelling of the right lower extremity with blebs started about 3 weeks prior to this admission. Because patient is blind she did not notice this. This was reported by patient's daughter.  On admission, patient was hemodynamically stable. CT of lower extremity did not show deep abscesses or bony involvement. She was started on vancomycin and Zosyn. Wound care has seen the patient in consultation and has recommended ortho consult.   Assessment/Plan:    Principal Problem:   Cellulitis of right lower extremity - WOC assessment done, appreciate their assessment  - RLE anterior and lateral aspects. Anterior hematoma measures 14cm x 8cm.Lateral LE with hematoma measuring 7cm x 6cm with an open full thickness wound embedded measuring 3cm x 2.5cm x 0.4cm - Per WOC dressing procedure/placement/frequency: twice daily saline dressings and floatation of heels off of the bed to avoid the incidence of pressure ulceration.Plan for ortho consult and evaluation  - Right lower extremity Doppler negative for DVT - continue current ABX and plan to narrow down in AM  Active Problems:   Hypokalemia - Supplemented and WNL    Benign essential HTN - Continue Norvasc 5 mg daily    Normocytic anemia - Hemoglobin stable at 10.6 - Monitor for bleeding    Hyponatremia - mild, monitor   DVT Prophylaxis  - Foot pumps bilaterally due to risk of bleeding, hematoma   Code Status: Full.  Family Communication:  plan of care discussed with the patient, family updated over the phone  Disposition Plan: Home when stable, needs PT eval, possibly by 02/17/2015    IV access:  Peripheral IV  Procedures and diagnostic studies:    Dg Tibia/fibula Right 02/13/2015   Soft tissue swelling without definite evidence of acute osseous pathology. MRI or white blood cells scintigraphy may provide better evaluation if there is high clinical concern for osteomyelitis. Electronically Signed   By: Elgie Collard M.D.   On: 02/13/2015 00:35   Ct Tibia Fibula Right W Contrast 02/13/2015  1. No evidence of fracture or dislocation. 2. Mild diffuse soft tissue edema tracks along the right leg, more prominent distally, with diffuse skin thickening about the level of the lower leg and ankle. 3. No evidence of abscess. 4. Mild scattered vascular calcifications seen. Electronically Signed   By: Roanna Raider M.D.   On: 02/13/2015 02:13   LE doppler 02/13/2015 - No DVT right LE  Medical Consultants:  Orthopedic surgery  Other Consultants:  PT eval  IAnti-Infectives:   Vanco and zosyn 02/13/2015 -->   Debbora Presto, MD  [facility] Pager [phone]  If 7PM-7AM, please contact night-coverage [email or web address] Password [email or web address]  Time spent in minutes: 25 minutes  02/14/2015, 11:55 AM   LOS: 1 day   HPI/Subjective: No acute overnight events. Patient reports feeling tired.   Objective: Filed Vitals:   02/13/15 0701 02/13/15 1304 02/13/15 2039 02/14/15 0451  BP: 144/64 148/51 150/47 154/42  Pulse: 71 74 75 66  Temp: 98.6 F (37 C) 98 F (36.7 C) 98.6 F (37 C) 97.9 F (36.6 C)  TempSrc: Oral Oral Oral Oral  Resp: Height:      Weight:  SpO2: 95% 100% 97% 97%    Intake/Output Summary (Last 24 hours) at 02/14/15 1155 Last data filed at 02/14/15 0700  Gross per 24 hour  Intake   1520 ml  Output   1200 ml  Net    320 ml    Exam:   General:  Pt is alert, follows commands appropriately, not in acute distress  Cardiovascular: Regular rate and rhythm, S1/S2 (+)  Respiratory: Clear to auscultation bilaterally, no wheezing, no  crackles, no rhonchi  Abdomen: Soft, non tender, non distended, bowel sounds present  Extremities: injury to right LE at the anterior and lateral aspects, anterior hematoma measures 14cm x 8cm and is mildly elevated. Lateral LE with hematoma 7cm x 6cm with an open full thickness wound embedded measuring 3cm x 2.5cm x 0.4cm, surrounding erythema  Data Reviewed: Basic Metabolic Panel:  Recent Labs Lab 02/12/15 1948 02/13/15 0515 02/14/15 0426  NA 134* 137 134*  K 4.0 3.1* 3.8  CL 97* 98* 99*  CO2 GLUCOSE 133* 101* 114*  BUN 25* 17 16  CREATININE 0.62 0.56 0.75  CALCIUM 9.4 8.5* 8.5*   Liver Function Tests:  Recent Labs Lab 02/12/15 1948 02/13/15 0515  AST 20 20  ALT 18 16  ALKPHOS 61 55  BILITOT 0.5 0.8  PROT 6.7 5.6*  ALBUMIN 3.6 3.0*   CBC:  Recent Labs Lab 02/12/15 1948 02/13/15 0515  WBC 8.2 6.3  NEUTROABS 6.0 4.2  HGB 11.8* 10.6*  HCT 36.9 32.9*  MCV 90.9 91.1  PLT 230 195   Scheduled Meds: . amLODipine  5 mg Oral Daily  . brinzolamide  1 drop Both Eyes TID  . chlorthalidone  25 mg Oral Daily  . piperacillin-tazobactam (ZOSYN)  IV  3.375 g Intravenous Q8H  . vancomycin  1,000 mg Intravenous Q24H   Continuous Infusions:

## 2015-02-15 LAB — CBC
HEMATOCRIT: 34.4 % — AB (ref 36.0–46.0)
HEMOGLOBIN: 11 g/dL — AB (ref 12.0–15.0)
MCH: 29.2 pg (ref 26.0–34.0)
MCHC: 32 g/dL (ref 30.0–36.0)
MCV: 91.2 fL (ref 78.0–100.0)
Platelets: 220 10*3/uL (ref 150–400)
RBC: 3.77 MIL/uL — AB (ref 3.87–5.11)
RDW: 13.9 % (ref 11.5–15.5)
WBC: 6.9 10*3/uL (ref 4.0–10.5)

## 2015-02-15 LAB — BASIC METABOLIC PANEL
ANION GAP: 10 (ref 5–15)
BUN: 14 mg/dL (ref 6–20)
CHLORIDE: 98 mmol/L — AB (ref 101–111)
CO2: 25 mmol/L (ref 22–32)
CREATININE: 0.68 mg/dL (ref 0.44–1.00)
Calcium: 8.9 mg/dL (ref 8.9–10.3)
GLUCOSE: 111 mg/dL — AB (ref 65–99)
POTASSIUM: 3.6 mmol/L (ref 3.5–5.1)
SODIUM: 133 mmol/L — AB (ref 135–145)

## 2015-02-15 MED ORDER — DOXYCYCLINE HYCLATE 100 MG PO TABS
100.0000 mg | ORAL_TABLET | Freq: Two times a day (BID) | ORAL | Status: DC
  Administered 2015-02-15 – 2015-02-16 (×3): 100 mg via ORAL
  Filled 2015-02-15 (×4): qty 1

## 2015-02-15 NOTE — Progress Notes (Signed)
CM notes pland is for pt to go to SNF; CSW arranging.  No other CM needs were communicated.

## 2015-02-15 NOTE — NC FL2 (Signed)
[place] MEDICAID FL2 LEVEL OF CARE SCREENING TOOL     IDENTIFICATION  Patient Name: Chelsea Hanson Birthdate: 1921-12-10 Sex: female Admission Date (Current Location): 02/12/2015  [facility] and [place] Number:      Facility and Address:  [facility],  [address] [place] [address]      Provider Number: [id number]  Attending Physician Name and Address:  Dorothea Ogle, MD  Relative Name and Phone Number:       Current Level of Care: SNF Recommended Level of Care: Skilled Nursing Facility Prior Approval Number:    Date Approved/Denied:   PASRR Number:    Discharge Plan: SNF    Current Diagnoses: Patient Active Problem List   Diagnosis Date Noted  . Hypokalemia 02/14/2015  . Benign essential HTN 02/14/2015  . Normocytic anemia 02/14/2015  . Cellulitis of right lower extremity 02/13/2015    Orientation RESPIRATION BLADDER Height & Weight    Self, Time, Situation, Place    Incontinent      BEHAVIORAL SYMPTOMS/MOOD NEUROLOGICAL BOWEL NUTRITION STATUS  Other (Comment) (no behaviors)   Continent Diet  AMBULATORY STATUS COMMUNICATION OF NEEDS Skin   Limited Assist    (cellulitis                                                                                                                                                                            )                       Personal Care Assistance Level of Assistance  Bathing, Feeding, Dressing Bathing Assistance: Limited assistance Feeding assistance: Independent Dressing Assistance: Limited assistance     Functional Limitations Info  Sight, Hearing, Speech Sight Info: Impaired Hearing Info: Adequate Speech Info: Adequate    SPECIAL CARE FACTORS FREQUENCY  PT (By licensed PT), OT (By licensed OT)     PT Frequency: 5 x wk OT Frequency: 5 x wk            Contractures Contractures Info: Not present    Additional Factors Info  Code Status Code Status Info: DNR              Current Medications (02/15/2015):  This is the current hospital active medication list Current Facility-Administered Medications  Medication Dose Route Frequency Provider Last Rate Last Dose  . acetaminophen (TYLENOL) tablet 650 mg  650 mg Oral Q6H PRN Eduard Clos, MD       Or  . acetaminophen (TYLENOL) suppository 650 mg  650 mg Rectal Q6H PRN Eduard Clos, MD      . amLODipine (NORVASC) tablet 5 mg  5 mg Oral [id number] Dorothea Ogle, MD  5 mg at 02/15/15 0703  . brinzolamide (AZOPT) 1 % ophthalmic suspension 1 drop  1 drop Both Eyes TID Eduard Clos, MD   1 drop at 02/15/15 0704  . chlorthalidone (HYGROTON) tablet 25 mg  25 mg Oral Q0600 Dorothea Ogle, MD   25 mg at 02/15/15 0706  . ondansetron (ZOFRAN) tablet 4 mg  4 mg Oral Q6H PRN Eduard Clos, MD       Or  . ondansetron [facility]) injection 4 mg  4 mg Intravenous Q6H PRN Eduard Clos, MD      . piperacillin-tazobactam (ZOSYN) IVPB 3.375 g  3.375 g Intravenous Q8H Lorenza Evangelist, RPH   3.375 g at 02/15/15 0133  . vancomycin (VANCOCIN) IVPB 1000 mg/200 mL premix  1,000 mg Intravenous Q24H Lorenza Evangelist, RPH 200 mL/hr at 02/15/15 0133 1,000 mg at 02/15/15 0133     Discharge Medications: Please see discharge summary for a list of discharge medications.  Relevant Imaging Results:  Relevant Lab Results:   Additional Information SS # [id number]  Destin Kittler, Dickey Gave, LCSW

## 2015-02-15 NOTE — Clinical Social Work Placement (Signed)
   CLINICAL SOCIAL WORK PLACEMENT  NOTE  Date:  02/15/2015  Patient Details  Name: Chelsea Hanson MRN: [id number] Date of Birth: July 27, 1921  Clinical Social Work is seeking post-discharge placement for this patient at the Skilled  Nursing Facility level of care (*CSW will initial, date and re-position this form in  chart as items are completed):  No   Patient/family provided with [place] Clinical Social Work Department's list of facilities offering this level of care within the geographic area requested by the patient (or if unable, by the patient's family).  Yes   Patient/family informed of their freedom to choose among providers that offer the needed level of care, that participate in Medicare, Medicaid or managed care program needed by the patient, have an available bed and are willing to accept the patient.  No   Patient/family informed of [facility]'s ownership interest in [facility] and [facility], as well as of the fact that they are under no obligation to receive care at these facilities.  PASRR submitted to EDS on 02/15/15     PASRR number received on 02/15/15     Existing PASRR number confirmed on       FL2 transmitted to all facilities in geographic area requested by pt/family on 02/15/15     FL2 transmitted to all facilities within larger geographic area on       Patient informed that his/her managed care company has contracts with or will negotiate with certain facilities, including the following:        Yes   Patient/family informed of bed offers received.  Patient chooses bed at [facility]     Physician recommends and patient chooses bed at [facility]    Patient to be transferred to   on  .  Patient to be transferred to facility by       Patient family notified on   of transfer.  Name of family member notified:        PHYSICIAN       Additional Comment:    _______________________________________________ Royetta Asal, LCSW   [phone] 02/15/2015, 1:56 PM

## 2015-02-15 NOTE — Progress Notes (Signed)
Patient ID: Chelsea Hanson, female   DOB: 1921/11/06, 82 y.o.   MRN: [id number] [facility] PROGRESS NOTE  Chelsea Hanson WUJ:[id number] DOB: [birth year] DOA: 02/12/2015 PCP: Eldridge Abrahams, MD  Brief narrative:    38 -year-old female with past medical history of glaucoma, blindness, hypertension who presented to [facility] long DD because of worsening redness and swelling of the right lower extremity with blebs started about 3 weeks prior to this admission. Because patient is blind she did not notice this. This was reported by patient's daughter.  On admission, patient was hemodynamically stable. CT of lower extremity did not show deep abscesses or bony involvement. She was started on vancomycin and Zosyn. Wound care has seen the patient in consultation and has recommended ortho consult.   Assessment/Plan:    Principal Problem:   Cellulitis of right lower extremity - WOC assessment done, appreciate their assessment  - RLE anterior and lateral aspects. Anterior hematoma measures 14cm x 8cm.Lateral LE with hematoma measuring 7cm x 6cm with an open full thickness wound embedded measuring 3cm x 2.5cm x 0.4cm - Per WOC dressing procedure/placement/frequency: twice daily saline dressings and floatation of heels off of the bed to avoid the incidence of pressure ulceration. - appreciate ortho input  - Right lower extremity Doppler negative for DVT - transition to oral ABX  Active Problems:   Blindness - please note this complicates pt's ability to perform wound care herself     Hypokalemia - Supplemented and WNL    Benign essential HTN - Continue Norvasc 5 mg daily    Normocytic anemia - no signs of bleeding  - Monitor for bleeding    Hyponatremia - mild, monitor   DVT Prophylaxis  - Foot pumps bilaterally due to risk of bleeding, hematoma   Code Status: Full.  Family Communication:  plan of care discussed with the patient, family updated over the phone  Disposition Plan: SNF by  02/17/2015   IV access:  Peripheral IV  Procedures and diagnostic studies:    Dg Tibia/fibula Right 02/13/2015   Soft tissue swelling without definite evidence of acute osseous pathology. MRI or white blood cells scintigraphy may provide better evaluation if there is high clinical concern for osteomyelitis. Electronically Signed   By: Elgie Collard M.D.   On: 02/13/2015 00:35   Ct Tibia Fibula Right W Contrast 02/13/2015  1. No evidence of fracture or dislocation. 2. Mild diffuse soft tissue edema tracks along the right leg, more prominent distally, with diffuse skin thickening about the level of the lower leg and ankle. 3. No evidence of abscess. 4. Mild scattered vascular calcifications seen. Electronically Signed   By: Roanna Raider M.D.   On: 02/13/2015 02:13   LE doppler 02/13/2015 - No DVT right LE  Medical Consultants:  Orthopedic surgery  Other Consultants:  PT eval  IAnti-Infectives:   Vanco and zosyn 02/13/2015 --> 1/19 Doxycycline 1/19 -->  Debbora Presto, MD  [facility] Pager [phone]  If 7PM-7AM, please contact night-coverage [email or web address] Password [email or web address]  Time spent in minutes: 25 minutes  02/15/2015, 1:09 PM   LOS: 2 days   HPI/Subjective: No acute overnight events. Patient reports feeling tired.   Objective: Filed Vitals:   02/14/15 0451 02/14/15 1413 02/14/15 2043 02/15/15 0600  BP: 154/42 150/66 138/55 150/92  Pulse: 66 69 68 80  Temp: 97.9 F (36.6 C) 98.3 F (36.8 C) 98 F (36.7 C) 97.8 F (36.6 C)  TempSrc: Oral Oral Oral Oral  Resp: 16  Height:      Weight:      SpO2: 97%  97% 96%    Intake/Output Summary (Last 24 hours) at 02/15/15 1309 Last data filed at 02/15/15 1248  Gross per 24 hour  Intake   1190 ml  Output   2402 ml  Net  -1212 ml    Exam:   General:  Pt is alert, follows commands appropriately, not in acute distress  Cardiovascular: Regular rate and rhythm, S1/S2 (+)  Respiratory: Clear to  auscultation bilaterally, no wheezing, no crackles, no rhonchi  Abdomen: Soft, non tender, non distended, bowel sounds present  Extremities: injury to right LE at the anterior and lateral aspects, anterior hematoma measures 14cm x 8cm and is mildly elevated. Lateral LE with hematoma 7cm x 6cm with an open full thickness wound embedded measuring 3cm x 2.5cm x 0.4cm, surrounding erythema  Data Reviewed: Basic Metabolic Panel:  Recent Labs Lab 02/12/15 1948 02/13/15 0515 02/14/15 0426 02/15/15 0500  NA 134* 137 134* 133*  K 4.0 3.1* 3.8 3.6  CL 97* 98* 99* 98*  CO2 GLUCOSE 133* 101* 114* 111*  BUN 25* CREATININE 0.62 0.56 0.75 0.68  CALCIUM 9.4 8.5* 8.5* 8.9   Liver Function Tests:  Recent Labs Lab 02/12/15 1948 02/13/15 0515  AST 20 20  ALT 18 16  ALKPHOS 61 55  BILITOT 0.5 0.8  PROT 6.7 5.6*  ALBUMIN 3.6 3.0*   CBC:  Recent Labs Lab 02/12/15 1948 02/13/15 0515 02/15/15 0500  WBC 8.2 6.3 6.9  NEUTROABS 6.0 4.2  --   HGB 11.8* 10.6* 11.0*  HCT 36.9 32.9* 34.4*  MCV 90.9 91.1 91.2  PLT 230 195 220   Scheduled Meds: . amLODipine  5 mg Oral Q0600  . brinzolamide  1 drop Both Eyes TID  . chlorthalidone  25 mg Oral Q0600  . piperacillin-tazobactam (ZOSYN)  IV  3.375 g Intravenous Q8H  . vancomycin  1,000 mg Intravenous Q24H   Continuous Infusions:

## 2015-02-15 NOTE — Clinical Social Work Note (Signed)
Clinical Social Work Assessment  Patient Details  Name: Chelsea Hanson MRN: [id number] Date of Birth: [birth year]  Date of referral:  02/15/15               Reason for consult:  Discharge Planning, Facility Placement                Permission sought to share information with:  Facility Sport and exercise psychologist, Family Supports Permission granted to share information::  Yes, Verbal Permission Granted  Name::        Agency::     Relationship::     Contact Information:     Housing/Transportation Living arrangements for the past 2 months:  Single Family Home Source of Information:  Patient, Adult Children Patient Interpreter Needed:  None Criminal Activity/Legal Involvement Pertinent to Current Situation/Hospitalization:  No - Comment as needed Significant Relationships:  Adult Children Lives with:    Do you feel safe going back to the place where you live?  No (SNF placement recommended.) Need for family participation in patient care:  Yes (Comment)  Care giving concerns:  Pt's care cannot be managed at home following hospital d/c.   Social Worker assessment / plan:  Pt hospitalized on 02/14/15 with Cellulitis of the RLE. CSW met with pt and spoke with pt's son and daughter to assist with d/c planning. ST SNF placement is required. Pt / family are in agreement with this plan and have requested [facility]. CSW has contacted SNF , clinicals sent, and d/c plan as been confirmed. CSW will continue to follow to assist with d/c planning needs.  Employment status:  Retired Forensic scientist:  Medicare PT Recommendations:  [place] / Referral to community resources:  [place]  Patient/Family's Response to care: Pt / family are in agreement with plan for ST SNF.  Patient/Family's Understanding of and Emotional Response to Diagnosis, Current Treatment, and Prognosis: Pt / family are aware of pt's medical status. Pt would prefer to return home at d/c  but is willing to try a short term placement for assistance with wound care and rehab. Pt is blind which limits her ability to care for her wounds. Family is relieved that pt is willing to go to SNF and are very pleased that [facility] is able to assist.  Emotional Assessment Appearance:  Appears stated age Attitude/Demeanor/Rapport:  Other (cooperative) Affect (typically observed):  Appropriate, Calm Orientation:  Oriented to Self, Oriented to Place, Oriented to  Time, Oriented to Situation Alcohol / Substance use:  Not Applicable Psych involvement (Current and /or in the community):  No (Comment)  Discharge Needs  Concerns to be addressed:  Discharge Planning Concerns Readmission within the last 30 days:  No Current discharge risk:  None Barriers to Discharge:  No Barriers Identified   [name], [place] 02/15/2015, 1:35 PM

## 2015-02-15 NOTE — Progress Notes (Signed)
Patient ID: Chelsea Hanson, female   DOB: 24-Sep-1921, 82 y.o.   MRN: [id number] Dressing intact on right leg.  MD to change at the bedside tomorrow.  She is comfortable and denies right leg pain.  I have spoken with her son at the bedside.

## 2015-02-15 NOTE — Evaluation (Signed)
Physical Therapy Evaluation Patient Details Name: Chelsea Hanson MRN: [id number] DOB: 15-Aug-1921 Today's Date: 02/15/2015   History of Present Illness  82 yo  female admitted 02/12/15 with what is felt to be cellulitis of her right leg.  X-rays negative for fracture or abscess. Ortho consulted. Noted  contusion with hematoma  r lower leg. Patiejt is legally blind and lives alone with some caregivers.  Clinical Impression  Patient is very motivated, able to ambulate with assist due to vision deficits for negotiating  Out of her environment. Patient will benefit from PT to address problems listed in the note below.  Follow Up Recommendations SNF;Supervision/Assistance - 24 hour    Equipment Recommendations  None recommended by PT    Recommendations for Other Services       Precautions / Restrictions Precautions Precautions: Fall Precaution Comments: legally blind      Mobility  Bed Mobility Overal bed mobility: Needs Assistance Bed Mobility: Supine to Sit     Supine to sit: Supervision     General bed mobility comments: patient wants no help  Transfers Overall transfer level: Needs assistance Equipment used: Rolling walker (2 wheeled) Transfers: Sit to/from Stand Sit to Stand: Min guard         General transfer comment: verbal cues  due to vision deficits.  Ambulation/Gait Ambulation/Gait assistance: Min guard;Min assist Ambulation Distance (Feet): 150 Feet Assistive device: Rolling walker (2 wheeled) Gait Pattern/deviations: Step-through pattern     General Gait Details: tactile and verbal cues for safety out of her environment  Stairs            Wheelchair Mobility    Modified Rankin (Stroke Patients Only)       Balance Overall balance assessment: Needs assistance Sitting-balance support: No upper extremity supported;Feet supported Sitting balance-Leahy Scale: Good                                       Pertinent Vitals/Pain  Pain Assessment: No/denies pain    Home Living Family/patient expects to be discharged to:: Private residence Living Arrangements: Alone Available Help at Discharge: Family Type of Home: House Home Access: Stairs to enter Entrance Stairs-Rails: Right;Left   Home Layout: One level Home Equipment: Environmental consultant - 2 wheels;Shower seat;Grab bars - tub/shower      Prior Function Level of Independence: Needs assistance   Gait / Transfers Assistance Needed: mod I with RW, goes up/down steps,   ADL's / Homemaking Assistance Needed: , hase house cleaner.        Hand Dominance        Extremity/Trunk Assessment   Upper Extremity Assessment: Overall WFL for tasks assessed           Lower Extremity Assessment: Generalized weakness      Cervical / Trunk Assessment: Normal  Communication      Cognition Arousal/Alertness: Awake/alert Behavior During Therapy: WFL for tasks assessed/performed Overall Cognitive Status: Within Functional Limits for tasks assessed                      General Comments      Exercises        Assessment/Plan    PT Assessment Patient needs continued PT services  PT Diagnosis Difficulty walking;Generalized weakness   PT Problem List Decreased strength;Decreased activity tolerance;Decreased balance;Decreased mobility;Decreased safety awareness  PT Treatment Interventions DME instruction;Gait training;Functional mobility training;Therapeutic activities;Therapeutic exercise;Patient/family education   PT  Goals (Current goals can be found in the Care Plan section) Acute Rehab PT Goals Patient Stated Goal: to show you that I can walk. Go to [facility] PT Goal Formulation: With patient/family Time For Goal Achievement: 03/01/15 Potential to Achieve Goals: Good    Frequency Min 3X/week   Barriers to discharge Decreased caregiver support      Co-evaluation               End of Session   Activity Tolerance: Patient tolerated treatment  well Patient left: in bed;with nursing/sitter in room;with family/visitor present;with call bell/phone within reach Nurse Communication: Mobility status         Time: 9147-8295 PT Time Calculation (min) (ACUTE ONLY): 25 min   Charges:   PT Evaluation $PT Eval Low Complexity: 1 Procedure PT Treatments $Gait Training: 8-22 mins   PT G Codes:        Rada Hay 02/15/2015, 12:33 PM Blanchard Kelch PT [phone]

## 2015-02-16 DIAGNOSIS — L02419 Cutaneous abscess of limb, unspecified: Secondary | ICD-10-CM

## 2015-02-16 DIAGNOSIS — L03119 Cellulitis of unspecified part of limb: Secondary | ICD-10-CM

## 2015-02-16 LAB — BASIC METABOLIC PANEL
Anion gap: 8 (ref 5–15)
BUN: 17 mg/dL (ref 6–20)
CALCIUM: 9.3 mg/dL (ref 8.9–10.3)
CHLORIDE: 98 mmol/L — AB (ref 101–111)
CO2: 27 mmol/L (ref 22–32)
CREATININE: 0.74 mg/dL (ref 0.44–1.00)
GFR calc Af Amer: >60 mL/min (ref >60)
GFR calc non Af Amer: >60 mL/min (ref >60)
GLUCOSE: 108 mg/dL — AB (ref 65–99)
Potassium: 3.5 mmol/L (ref 3.5–5.1)
Sodium: 133 mmol/L — ABNORMAL LOW (ref 135–145)

## 2015-02-16 LAB — CBC
HEMATOCRIT: 35.5 % — AB (ref 36.0–46.0)
HEMOGLOBIN: 11.3 g/dL — AB (ref 12.0–15.0)
MCH: 28.9 pg (ref 26.0–34.0)
MCHC: 31.8 g/dL (ref 30.0–36.0)
MCV: 90.8 fL (ref 78.0–100.0)
Platelets: 231 10*3/uL (ref 150–400)
RBC: 3.91 MIL/uL (ref 3.87–5.11)
RDW: 13.8 % (ref 11.5–15.5)
WBC: 6.4 10*3/uL (ref 4.0–10.5)

## 2015-02-16 MED ORDER — DOXYCYCLINE HYCLATE 100 MG PO TABS: 100.0000 mg | ORAL_TABLET | Freq: Two times a day (BID) | ORAL | Status: DC

## 2015-02-16 NOTE — Discharge Summary (Signed)
Physician Discharge Summary  Chelsea Hanson ZOX:[id number] DOB: Apr 03, 1921 DOA: 02/12/2015  PCP: Eldridge Abrahams, MD  Admit date: 02/12/2015 Discharge date: 02/16/2015  Recommendations for Outpatient Follow-up:  1. Pt will need to follow up with PCP in 2-3 weeks post discharge 2. Please obtain BMP to evaluate electrolytes and kidney function 3. Cellulitis resolved, complete doxycycline treatment   Discharge Diagnoses:  Principal Problem:   Cellulitis of right lower extremity Active Problems:   Hypokalemia   Benign essential HTN   Normocytic anemia  Discharge Condition: Stable  Diet recommendation: Heart healthy diet discussed in details   Brief narrative:    82 -year-old female with past medical history of glaucoma, blindness, hypertension who presented to [facility] long DD because of worsening redness and swelling of the right lower extremity with blebs started about 3 weeks prior to this admission. Because patient is blind she did not notice this. This was reported by patient's daughter.  On admission, patient was hemodynamically stable. CT of lower extremity did not show deep abscesses or bony involvement. She was started on vancomycin and Zosyn. Wound care has seen the patient in consultation and has recommended ortho consult.   Assessment/Plan:    Principal Problem:  Cellulitis of right lower extremity - WOC assessment done, appreciate their assessment  - RLE anterior and lateral aspects. Anterior hematoma measures 14cm x 8cm.Lateral LE with hematoma measuring 7cm x 6cm with an open full thickness wound embedded measuring 3cm x 2.5cm x 0.4cm - Per WOC dressing procedure/placement/frequency: twice daily saline dressings and floatation of heels off of the bed to avoid the incidence of pressure ulceration. - appreciate ortho input, cellulitis resolved   - Right lower extremity Doppler negative for DVT - transitioned to oral ABX doxycycline   Active Problems:   Blindness - please note this complicates pt's ability to perform wound care herself    Hypokalemia - Supplemented and WNL   Benign essential HTN - Continue Norvasc 5 mg daily   Normocytic anemia - no signs of bleeding    Hyponatremia - mild, no need for further monitoring    Code Status: Full.  Family Communication: plan of care discussed with the patient, family updated over the phone  Disposition Plan: SNF  IV access:  Peripheral IV  Procedures and diagnostic studies:   Dg Tibia/fibula Right 11-Mar-2015 Soft tissue swelling without definite evidence of acute osseous pathology. MRI or white blood cells scintigraphy may provide better evaluation if there is high clinical concern for osteomyelitis. Electronically Signed By: Elgie Collard M.D. On: Mar 11, 2015 00:35   Ct Tibia Fibula Right W Contrast 2015/03/11 1. No evidence of fracture or dislocation. 2. Mild diffuse soft tissue edema tracks along the right leg, more prominent distally, with diffuse skin thickening about the level of the lower leg and ankle. 3. No evidence of abscess. 4. Mild scattered vascular calcifications seen. Electronically Signed By: Roanna Raider M.D. On: 03-11-2015 02:13   LE doppler March 11, 2015 - No DVT right LE  Medical Consultants:  Orthopedic surgery  Other Consultants:  PT eval  IAnti-Infectives:   Vanco and zosyn 03-11-15 --> 1/19 Doxycycline 1/19 -->     Discharge Exam: Filed Vitals:   02/15/15 2149 02/16/15 0616  BP: 124/45 115/53  Pulse: 73 69  Temp: 98 F (36.7 C) 98.3 F (36.8 C)  Resp: 16 16   Filed Vitals:   02/14/15 2043 02/15/15 0600 02/15/15 2149 02/16/15 0616  BP: 138/55 150/92 124/45 115/53  Pulse: 68 80 73 69  Temp: 98 F (  36.7 C) 97.8 F (36.6 C) 98 F (36.7 C) 98.3 F (36.8 C)  TempSrc: Oral Oral Oral Oral  Resp: Height:      Weight:      SpO2: 97% 96% 96% 95%    General: Pt is alert, follows commands  appropriately, not in acute distress Cardiovascular: Regular rate and rhythm, S1/S2 +, no murmurs, no rubs, no gallops Respiratory: Clear to auscultation bilaterally, no wheezing, no crackles, no rhonchi Abdominal: Soft, non tender, non distended, bowel sounds +, no guarding  Discharge Instructions  Discharge Instructions    Diet - low sodium heart healthy    Complete by:  As directed      Increase activity slowly    Complete by:  As directed             Medication List    TAKE these medications        amLODipine 5 MG tablet  Commonly known as:  NORVASC  Take 5 mg by mouth daily.     brinzolamide 1 % ophthalmic suspension  Commonly known as:  AZOPT  Place 1 drop into both eyes 3 (three) times daily.     chlorthalidone 25 MG tablet  Commonly known as:  HYGROTON  Take 25 mg by mouth daily.     doxycycline 100 MG tablet  Commonly known as:  VIBRA-TABS  Take 1 tablet (100 mg total) by mouth every 12 (twelve) hours.     ESTER C PO  Take 2 tablets by mouth daily.           Follow-up Information    Follow up with POMPOSINI,DANIEL L, MD.   Specialty:  Internal Medicine       The results of significant diagnostics from this hospitalization (including imaging, microbiology, ancillary and laboratory) are listed below for reference.     Microbiology: No results found for this or any previous visit (from the past 240 hour(s)).   Labs: Basic Metabolic Panel:  Recent Labs Lab 02/12/15 1948 02/13/15 0515 02/14/15 0426 02/15/15 0500 02/16/15 0430  NA 134* 137 134* 133* 133*  K 4.0 3.1* 3.8 3.6 3.5  CL 97* 98* 99* 98* 98*  CO2 GLUCOSE 133* 101* 114* 111* 108*  BUN 25* CREATININE 0.62 0.56 0.75 0.68 0.74  CALCIUM 9.4 8.5* 8.5* 8.9 9.3   Liver Function Tests:  Recent Labs Lab 02/12/15 1948 02/13/15 0515  AST 20 20  ALT 18 16  ALKPHOS 61 55  BILITOT 0.5 0.8  PROT 6.7 5.6*  ALBUMIN 3.6 3.0*   CBC:  Recent Labs Lab  02/12/15 1948 02/13/15 0515 02/15/15 0500 02/16/15 0430  WBC 8.2 6.3 6.9 6.4  NEUTROABS 6.0 4.2  --   --   HGB 11.8* 10.6* 11.0* 11.3*  HCT 36.9 32.9* 34.4* 35.5*  MCV 90.9 91.1 91.2 90.8  PLT 230 195 220 231    SIGNED: Time coordinating discharge: 30 minutes  MAGICK-Leone Putman, MD  [facility] 02/16/2015, 8:40 AM Pager [phone]  If 7PM-7AM, please contact night-coverage [email or web address] Password [email or web address]

## 2015-02-16 NOTE — Progress Notes (Signed)
Patient ID: Chelsea Hanson, female   DOB: 09-04-1921, 82 y.o.   MRN: [id number] Right leg wound looks great.  No cellulitis.  Ne dressing applied that should stay on for the next 3 days (until 1/23). Then dry dressing daily as needed.

## 2015-02-16 NOTE — Clinical Social Work Placement (Signed)
   CLINICAL SOCIAL WORK PLACEMENT  NOTE  Date:  02/16/2015  Patient Details  Name: Chelsea Hanson MRN: [id number] Date of Birth: March 13, 1921  Clinical Social Work is seeking post-discharge placement for this patient at the Skilled  Nursing Facility level of care (*CSW will initial, date and re-position this form in  chart as items are completed):  No   Patient/family provided with Agency Clinical Social Work Department's list of facilities offering this level of care within the geographic area requested by the patient (or if unable, by the patient's family).  Yes   Patient/family informed of their freedom to choose among providers that offer the needed level of care, that participate in Medicare, Medicaid or managed care program needed by the patient, have an available bed and are willing to accept the patient.  No   Patient/family informed of [facility]'s ownership interest in [facility] and [facility], as well as of the fact that they are under no obligation to receive care at these facilities.  PASRR submitted to EDS on 02/15/15     PASRR number received on 02/15/15     Existing PASRR number confirmed on       FL2 transmitted to all facilities in geographic area requested by pt/family on 02/15/15     FL2 transmitted to all facilities within larger geographic area on       Patient informed that his/her managed care company has contracts with or will negotiate with certain facilities, including the following:        Yes   Patient/family informed of bed offers received.  Patient chooses bed at [facility]     Physician recommends and patient chooses bed at [facility]    Patient to be transferred to [facility] on 02/16/15.  Patient to be transferred to facility by CAR     Patient family notified on 02/16/15 of transfer.  Name of family member notified:  SON     PHYSICIAN       Additional Comment: Pt / son are in agreement with d/c to [facility] today.  PT approved transport by car. NSG reviewed d/c summary, scripts, avs. Scripts included in d/c packet. D/C packet provided to pt prior to d/c. D/C summary sent to SNF for review prior to d/c.   _______________________________________________ Royetta Asal, LCSW  [phone] 02/16/2015, 2:13 PM

## 2015-02-16 NOTE — Care Management Important Message (Signed)
Important Message  Patient Details  Name: Chelsea Hanson MRN: [id number] Date of Birth: 01-20-1922   Medicare Important Message Given:  Yes    Haskell Flirt 02/16/2015, 12:49 PMImportant Message  Patient Details  Name: Chelsea Hanson MRN: [id number] Date of Birth: 1921-03-24   Medicare Important Message Given:  Yes    Haskell Flirt 02/16/2015, 12:48 PM

## 2015-02-16 NOTE — Progress Notes (Signed)
Called report and left message with Theatre stage manager.  Sharrell Ku RN

## 2015-02-16 NOTE — Discharge Instructions (Signed)
Cellulitis Cellulitis is an infection of the skin and the tissue beneath it. The infected area is usually red and tender. Cellulitis occurs most often in the arms and lower legs.  CAUSES  Cellulitis is caused by bacteria that enter the skin through cracks or cuts in the skin. The most common types of bacteria that cause cellulitis are staphylococci and streptococci. SIGNS AND SYMPTOMS   Redness and warmth.  Swelling.  Tenderness or pain.  Fever. DIAGNOSIS  Your health care provider can usually determine what is wrong based on a physical exam. Blood tests may also be done. TREATMENT  Treatment usually involves taking an antibiotic medicine. HOME CARE INSTRUCTIONS   Take your antibiotic medicine as directed by your health care provider. Finish the antibiotic even if you start to feel better.  Keep the infected arm or leg elevated to reduce swelling.  Apply a warm cloth to the affected area up to 4 times per day to relieve pain.  Take medicines only as directed by your health care provider.  Keep all follow-up visits as directed by your health care provider. SEEK MEDICAL CARE IF:   You notice red streaks coming from the infected area.  Your red area gets larger or turns dark in color.  Your bone or joint underneath the infected area becomes painful after the skin has healed.  Your infection returns in the same area or another area.  You notice a swollen bump in the infected area.  You develop new symptoms.  You have a fever. SEEK IMMEDIATE MEDICAL CARE IF:   You feel very sleepy.  You develop vomiting or diarrhea.  You have a general ill feeling (malaise) with muscle aches and pains.   This information is not intended to replace advice given to you by your health care provider. Make sure you discuss any questions you have with your health care provider.   Document Released: 10/23/2004 Document Revised: 10/04/2014 Document Reviewed: 03/31/2011 Elsevier Interactive  Patient Education 2016 [employer]   LEAVE CURRENT RIGHT LEG DRESSING ALONE AND IN PLACE UNTIL Monday 02/19/15. NEW DRY DRESSING DAILY AS NEEDED STARTING 02/19/15.

## 2015-02-19 ENCOUNTER — Non-Acute Institutional Stay (SKILLED_NURSING_FACILITY): Payer: Medicare Other | Admitting: Internal Medicine

## 2015-02-19 DIAGNOSIS — R531 Weakness: Secondary | ICD-10-CM

## 2015-02-19 DIAGNOSIS — I10 Essential (primary) hypertension: Secondary | ICD-10-CM | POA: Diagnosis not present

## 2015-02-19 DIAGNOSIS — H540 Blindness, both eyes: Secondary | ICD-10-CM

## 2015-02-19 DIAGNOSIS — S8011XS Contusion of right lower leg, sequela: Secondary | ICD-10-CM | POA: Diagnosis not present

## 2015-02-19 DIAGNOSIS — D649 Anemia, unspecified: Secondary | ICD-10-CM

## 2015-02-19 DIAGNOSIS — H409 Unspecified glaucoma: Secondary | ICD-10-CM | POA: Diagnosis not present

## 2015-02-19 DIAGNOSIS — H547 Unspecified visual loss: Secondary | ICD-10-CM

## 2015-02-19 DIAGNOSIS — L03115 Cellulitis of right lower limb: Secondary | ICD-10-CM

## 2015-02-19 NOTE — Progress Notes (Signed)
Patient ID: [name], female   DOB: 03-26-1921, 82 y.o.   MRN: [id number]     [facility]   PCP: POMPOSINI,DANIEL L, MD  Code Status: DNR  Allergies  Allergen Reactions  . Codeine Other (See Comments)    Makes her crazy.    Chief Complaint  Patient presents with  . New Admit To SNF     HPI:  82 y.o. patient is here for short term rehabilitation post hospital admission from 80/16/17-02/16/15 with RLE cellulitis. CT scan of her leg did not show deep abscess or bone involvement. She was started on vancomycin and zosyn. DVT was ruled out. Patient was followed by wound care team for hematoma. Her celluliits improved and she was transitioned to po antibiotics. She has PMH of glaucoma, blindness, HTN. She is seen in her room today. She would like her vitamin d 1000 u a day started. Denies any other concerns.   Review of Systems:  Constitutional: Negative for fever, chills, diaphoresis.  HENT: Negative for headache, congestion, nasal discharge, difficulty swallowing.   Eyes: legally blind Respiratory: Negative for cough, shortness of breath and wheezing.   Cardiovascular: Negative for chest pain, palpitations, leg swelling.  Gastrointestinal: Negative for heartburn, nausea, vomiting, abdominal pain, had bowel movement in am Genitourinary: Negative for dysuria.  Musculoskeletal: Negative for back pain, falls in the facility Skin: Negative for itching, rash.  Neurological: Negative for dizziness Psychiatric/Behavioral: Negative for depression   No past medical history on file. Past Surgical History  Procedure Laterality Date  . Cyst removal neck     Social History:   reports that she has never smoked. She does not have any smokeless tobacco history on file. She reports that she does not drink alcohol or use illicit drugs.  Family History  Problem Relation Age of Onset  . CAD Father     Medications:   Medication List       This list is  accurate as of: 02/19/15 12:35 PM.  Always use your most recent med list.               amLODipine 5 MG tablet  Commonly known as:  NORVASC  Take 5 mg by mouth daily.     brinzolamide 1 % ophthalmic suspension  Commonly known as:  AZOPT  Place 1 drop into both eyes 3 (three) times daily.     chlorthalidone 25 MG tablet  Commonly known as:  HYGROTON  Take 25 mg by mouth daily.     doxycycline 100 MG tablet  Commonly known as:  VIBRA-TABS  Take 1 tablet (100 mg total) by mouth every 12 (twelve) hours.     ESTER C PO  Take 2 tablets by mouth daily.         Physical Exam: Filed Vitals:   02/19/15 1234  BP: 135/60  Pulse: 74  Temp: 98 F (36.7 C)  Resp: 18  SpO2: 96%    General- elderly female, well built, in no acute distress Head- normocephalic, atraumatic Nose- no maxillary or frontal sinus tenderness, no nasal discharge Throat- moist mucus membrane Eyes- no pallor, no icterus, no discharge, normal conjunctiva, normal sclera Neck- no cervical lymphadenopathy Cardiovascular- normal s1,s2, no leg edema Respiratory- bilateral clear to auscultation, no wheeze, no rhonchi, no crackles, no use of accessory muscles Abdomen- bowel sounds present, soft, non tender Musculoskeletal- able to move all 4 extremities, generalized weakness noted  Neurological- alert and oriented to person, place and time Skin- warm  and dry, right lower extremity has ACE wrap and patient refuses it from being removed Psychiatry- normal mood and affect    Labs reviewed: Basic Metabolic Panel:  Recent Labs  16/10/96 0426 02/15/15 0500 02/16/15 0430  NA 134* 133* 133*  K 3.8 3.6 3.5  CL 99* 98* 98*  CO2 GLUCOSE 114* 111* 108*  BUN CREATININE 0.75 0.68 0.74  CALCIUM 8.5* 8.9 9.3   Liver Function Tests:  Recent Labs  02/12/15 1948 02/13/15 0515  AST 20 20  ALT 18 16  ALKPHOS 61 55  BILITOT 0.5 0.8  PROT 6.7 5.6*  ALBUMIN 3.6 3.0*   No results for  input(s): LIPASE, AMYLASE in the last 8760 hours. No results for input(s): AMMONIA in the last 8760 hours. CBC:  Recent Labs  02/12/15 1948 02/13/15 0515 02/15/15 0500 02/16/15 0430  WBC 8.2 6.3 6.9 6.4  NEUTROABS 6.0 4.2  --   --   HGB 11.8* 10.6* 11.0* 11.3*  HCT 36.9 32.9* 34.4* 35.5*  MCV 90.9 91.1 91.2 90.8  PLT 230 195 220 231   Cardiac Enzymes: No results for input(s): CKTOTAL, CKMB, CKMBINDEX, TROPONINI in the last 8760 hours. BNP: Invalid input(s): POCBNP CBG: No results for input(s): GLUCAP in the last 8760 hours.  Radiological Exams: Dg Tibia/fibula Right  02/13/2015  CLINICAL DATA:  82 year old female with skin infection and concern for osteomyelitis. EXAM: RIGHT TIBIA AND FIBULA - 2 VIEW COMPARISON:  None. FINDINGS: There is no acute fracture or dislocation. Mild osteopenia. There is no bone erosion or periosteal reaction. There is mild diffuse subcutaneous soft tissue stranding likely cellulitis. A focal area of skin thickening and irregularity noted at the anterior lateral aspect of the mid portion of the leg, likely the area of focal skin infection and wound. No soft tissue gas identified. IMPRESSION: Soft tissue swelling without definite evidence of acute osseous pathology. MRI or white blood cells scintigraphy may provide better evaluation if there is high clinical concern for osteomyelitis. Electronically Signed   By: Elgie Collard M.D.   On: 02/13/2015 00:35   Ct Tibia Fibula Right W Contrast  02/13/2015  CLINICAL DATA:  Swelling and discoloration along the right lower leg, of [age] indeterminate age. Initial encounter. EXAM: CT OF THE LOWER RIGHT EXTREMITY WITH CONTRAST TECHNIQUE: Multidetector CT imaging of the right tibia and fibula was performed according to the standard protocol following intravenous contrast administration. COMPARISON:  Right tibia/fibula radiographs performed earlier today at 12:26 a.m. CONTRAST:  OMNIPAQUE IOHEXOL 300 MG/ML  SOLN FINDINGS:  The tibia and fibula appear intact. There is no evidence of fracture or dislocation. Mild diffuse soft tissue edema is noted tracking along the right leg, more prominent distally, with diffuse skin thickening noted about the level of the lower leg and ankle. The underlying musculature is grossly unremarkable. The visualized vasculature appears intact, aside from scattered calcification along the popliteal artery and its branches. There is no evidence of abscess. No knee joint effusion is identified. Visualized soft tissue structures about the knee are grossly unremarkable. IMPRESSION: 1. No evidence of fracture or dislocation. 2. Mild diffuse soft tissue edema tracks along the right leg, more prominent distally, with diffuse skin thickening about the level of the lower leg and ankle. 3. No evidence of abscess. 4. Mild scattered vascular calcifications seen. Electronically Signed   By: Roanna Raider M.D.   On: 02/13/2015 02:13    Assessment/Plan  Generalized weakness Will have her work with  physical therapy and occupational therapy team to help with gait training and muscle strengthening exercises.fall precautions. Skin care. Encourage to be out of bed.   RLE cellulitis Continue and complete course of doxycycline 100 mg bid on 02/23/15. Add florastor 250 mg bid to help prevent antibiotic associated diarrhea  HTN Stable bp, continue norvasc 5 mg daily and chlorthalidone 25 mg daily and monitor BP, check bmp  Glaucoma Continue azopt eye drop  Blindness Fall precautions, assistance with ADLs as needed  Leg hematoma Seen by wound nurse in hospital and per d/c summary requires twice daily saline dressing and heel floaters, patient refuses removal of ACE wrap. So does family as they say the hospital physician had mentioned above not removing the dressing. Staff to get clarification from hospital on this.   anemia Likely from the hematoma, monitor cbc  Goals of care: short term  rehabilitation   Labs/tests ordered: cbc, cmp  Family/ staff Communication: reviewed care plan with patient and nursing supervisor    Oneal Grout, MD  [facility] [phone] (Monday-Friday 8 am - 5 pm) [phone] (afterhours)

## 2015-02-20 LAB — CBC AND DIFFERENTIAL
HCT: 34 % — AB (ref 36–46)
Hemoglobin: 11.2 g/dL — AB (ref 12.0–16.0)
NEUTROS ABS: 7 /uL
Platelets: 245 10*3/uL (ref 150–399)
WBC: 9.7 10^3/mL

## 2015-02-20 LAB — HEPATIC FUNCTION PANEL
ALK PHOS: 62 U/L (ref 25–125)
ALT: 12 U/L (ref 7–35)
AST: 13 U/L (ref 13–35)
Bilirubin, Total: 0.8 mg/dL

## 2015-02-20 LAB — BASIC METABOLIC PANEL
BUN: 28 mg/dL — AB (ref 4–21)
CREATININE: 0.7 mg/dL (ref 0.5–1.1)
Glucose: 155 mg/dL
Potassium: 2.9 mmol/L — AB (ref 3.4–5.3)
SODIUM: 132 mmol/L — AB (ref 137–147)

## 2015-02-21 LAB — BASIC METABOLIC PANEL: POTASSIUM: 3.5 mmol/L (ref 3.4–5.3)

## 2015-02-26 LAB — BASIC METABOLIC PANEL
BUN: 21 mg/dL (ref 4–21)
Creatinine: 0.7 mg/dL (ref 0.5–1.1)
GLUCOSE: 127 mg/dL
POTASSIUM: 3.5 mmol/L (ref 3.4–5.3)
SODIUM: 137 mmol/L (ref 137–147)

## 2015-03-08 ENCOUNTER — Encounter: Payer: Self-pay | Admitting: Adult Health

## 2015-03-08 ENCOUNTER — Non-Acute Institutional Stay (SKILLED_NURSING_FACILITY): Payer: Medicare Other | Admitting: Adult Health

## 2015-03-08 DIAGNOSIS — H540 Blindness, both eyes: Secondary | ICD-10-CM | POA: Diagnosis not present

## 2015-03-08 DIAGNOSIS — D649 Anemia, unspecified: Secondary | ICD-10-CM | POA: Diagnosis not present

## 2015-03-08 DIAGNOSIS — E876 Hypokalemia: Secondary | ICD-10-CM

## 2015-03-08 DIAGNOSIS — R531 Weakness: Secondary | ICD-10-CM

## 2015-03-08 DIAGNOSIS — H409 Unspecified glaucoma: Secondary | ICD-10-CM | POA: Diagnosis not present

## 2015-03-08 DIAGNOSIS — E46 Unspecified protein-calorie malnutrition: Secondary | ICD-10-CM

## 2015-03-08 DIAGNOSIS — I10 Essential (primary) hypertension: Secondary | ICD-10-CM | POA: Diagnosis not present

## 2015-03-08 DIAGNOSIS — H547 Unspecified visual loss: Secondary | ICD-10-CM

## 2015-03-08 NOTE — Progress Notes (Signed)
Patient ID: Chelsea Hanson, female   DOB: 02-26-1921, 82 y.o.   MRN: [id number]    DATE:  03/08/15  MRN:  [id number]  BIRTHDAY: October 25, 1921  Facility:  Nursing Home Location:  [facility]  Nursing Home Room Number: 808-P  LEVEL OF CARE:  SNF (31)  Contact Information    Name Relation Home Work Mobile   [place] Daughter   [phone]   Lendon Colonel   [phone]       Code Status History    Date Active Date Inactive Code Status Order ID Comments User Context   02/13/2015  3:47 AM 02/16/2015  4:08 PM DNR [id number]  Eduard Clos, MD Inpatient    Questions for Most Recent Historical Code Status (Order [id number])    Question Answer Comment   In the event of cardiac or respiratory ARREST Do not call a "code blue"    In the event of cardiac or respiratory ARREST Do not perform Intubation, CPR, defibrillation or ACLS    In the event of cardiac or respiratory ARREST Use medication by any route, position, wound care, and other measures to relive pain and suffering. May use oxygen, suction and manual treatment of airway obstruction as needed for comfort.     Advance Directive Documentation        Most Recent Value   Type of Advance Directive  Out of facility DNR (pink MOST or yellow form)   Pre-existing out of facility DNR order (yellow form or pink MOST form)     "MOST" Form in Place?         Chief Complaint  Patient presents with  . Discharge Note    HISTORY OF PRESENT ILLNESS:  This is a 82 year-old female who is for discharge home with Home health PT for endurance, OT for ADLs and CNA for showers, She has been admitted to [facility] on 02/16/15 from [facility] with RLE cellulitis. CT scan of her leg did not show deep abscess or bone involvement. She was started on vancomycin and zosyn. DVT was ruled out. Patient was followed by wound care team for hematoma. Her celluliits improved and she was transitioned to po antibiotics. She has PMH of  glaucoma, blindness, HTN.  Patient was admitted to this facility for short-term rehabilitation after the patient's recent hospitalization.  Patient has completed SNF rehabilitation and therapy has cleared the patient for discharge.   PAST MEDICAL HISTORY:  Past Medical History  Diagnosis Date  . Generalized weakness   . Cellulitis of right lower extremity   . Benign essential HTN   . Anemia, unspecified   . Hematoma of right lower extremity   . Blindness   . Glaucoma      CURRENT MEDICATIONS: Reviewed  Patient's Medications  New Prescriptions   No medications on file  Previous Medications   AMLODIPINE (NORVASC) 5 MG TABLET    Take 5 mg by mouth daily.   BIOFLAVONOID PRODUCTS (ESTER C PO)    Take 2 tablets by mouth daily.   BRINZOLAMIDE (AZOPT) 1 % OPHTHALMIC SUSPENSION    Place 1 drop into the right eye 2 (two) times daily.    CHLORTHALIDONE (HYGROTON) 25 MG TABLET    Take 25 mg by mouth daily.   CHOLECALCIFEROL (VITAMIN D-3 PO)    Take 1,000 Units by mouth daily.   POTASSIUM CHLORIDE (K-DUR) 10 MEQ TABLET    Take 10 mEq by mouth daily.   PROTEIN (PROCEL) POWD    Take  1 scoop by mouth 2 (two) times daily.  Modified Medications   No medications on file  Discontinued Medications   No medications on file     Allergies  Allergen Reactions  . Codeine Other (See Comments)    Makes her crazy.  Marland Kitchen Doxycycline Itching     REVIEW OF SYSTEMS:  GENERAL: no change in appetite, no fatigue, no weight changes, no fever, chills or weakness EYES: legally blind EARS: Denies change in hearing, ringing in ears, or earache NOSE: Denies nasal congestion or epistaxis MOUTH and THROAT: Denies oral discomfort, gingival pain or bleeding, pain from teeth or hoarseness   RESPIRATORY: no cough, SOB, DOE, wheezing, hemoptysis CARDIAC: no chest pain, edema or palpitations GI: no abdominal pain, diarrhea, constipation, heart burn, nausea or vomiting GU: Denies dysuria, frequency, hematuria,  incontinence, or discharge PSYCHIATRIC: Denies feeling of depression or anxiety. No report of hallucinations, insomnia, paranoia, or agitation   PHYSICAL EXAMINATION  GENERAL APPEARANCE: Well nourished. In no acute distress. Normal body habitus SKIN:  Right shin wound is covered with dressing with slight erythema HEAD: Normal in size and contour. No evidence of trauma EYES: Lids open and close normally. No blepharitis, entropion or ectropion. Conjunctivae are clear and sclerae are white.  EARS: Pinnae are normal. Patient hears normal voice tunes of the examiner MOUTH and THROAT: Lips are without lesions. Oral mucosa is moist and without lesions. Tongue is normal in shape, size, and color and without lesions NECK: supple, trachea midline, no neck masses, no thyroid tenderness, no thyromegaly LYMPHATICS: no LAN in the neck, no supraclavicular LAN RESPIRATORY: breathing is even & unlabored, BS CTAB CARDIAC: RRR, no murmur,no extra heart sounds, no edema GI: abdomen soft, normal BS, no masses, no tenderness, no hepatomegaly, no splenomegaly EXTREMITIES:  Able to move X 4 extremities PSYCHIATRIC: Alert and oriented X 3. Affect and behavior are appropriate  LABS/RADIOLOGY: Labs reviewed: Basic Metabolic Panel:  Recent Labs  40/98/11 0426 02/15/15 0500 02/16/15 0430 02/20/15 02/21/15 02/26/15  NA 134* 133* 133* 132*  --  137  K 3.8 3.6 3.5 2.9* 3.5 3.5  CL 99* 98* 98*  --   --   --   CO2 --   --   --   GLUCOSE 114* 111* 108*  --   --   --   BUN 28*  --  21  CREATININE 0.75 0.68 0.74 0.7  --  0.7  CALCIUM 8.5* 8.9 9.3  --   --   --    Liver Function Tests:  Recent Labs  02/12/15 1948 02/13/15 0515 02/20/15  AST ALT ALKPHOS 61 55 62  BILITOT 0.5 0.8  --   PROT 6.7 5.6*  --   ALBUMIN 3.6 3.0*  --    CBC:  Recent Labs  02/12/15 1948 02/13/15 0515 02/15/15 0500 02/16/15 0430 02/20/15  WBC 8.2 6.3 6.9 6.4 9.7  NEUTROABS 6.0 4.2  --    --  7  HGB 11.8* 10.6* 11.0* 11.3* 11.2*  HCT 36.9 32.9* 34.4* 35.5* 34*  MCV 90.9 91.1 91.2 90.8  --   PLT 230 195 220 231 245     Dg Tibia/fibula Right  02/13/2015  CLINICAL DATA:  82 year old female with skin infection and concern for osteomyelitis. EXAM: RIGHT TIBIA AND FIBULA - 2 VIEW COMPARISON:  None. FINDINGS: There is no acute fracture or dislocation. Mild osteopenia. There is no bone erosion or  periosteal reaction. There is mild diffuse subcutaneous soft tissue stranding likely cellulitis. A focal area of skin thickening and irregularity noted at the anterior lateral aspect of the mid portion of the leg, likely the area of focal skin infection and wound. No soft tissue gas identified. IMPRESSION: Soft tissue swelling without definite evidence of acute osseous pathology. MRI or white blood cells scintigraphy may provide better evaluation if there is high clinical concern for osteomyelitis. Electronically Signed   By: Elgie Collard M.D.   On: 02/13/2015 00:35   Ct Tibia Fibula Right W Contrast  02/13/2015  CLINICAL DATA:  Swelling and discoloration along the right lower leg, of indeterminate age. Initial encounter. EXAM: CT OF THE LOWER RIGHT EXTREMITY WITH CONTRAST TECHNIQUE: Multidetector CT imaging of the right tibia and fibula was performed according to the standard protocol following intravenous contrast administration. COMPARISON:  Right tibia/fibula radiographs performed earlier today at 12:26 a.m. CONTRAST:  OMNIPAQUE IOHEXOL 300 MG/ML  SOLN FINDINGS: The tibia and fibula appear intact. There is no evidence of fracture or dislocation. Mild diffuse soft tissue edema is noted tracking along the right leg, more prominent distally, with diffuse skin thickening noted about the level of the lower leg and ankle. The underlying musculature is grossly unremarkable. The visualized vasculature appears intact, aside from scattered calcification along the popliteal artery and its branches.  There is no evidence of abscess. No knee joint effusion is identified. Visualized soft tissue structures about the knee are grossly unremarkable. IMPRESSION: 1. No evidence of fracture or dislocation. 2. Mild diffuse soft tissue edema tracks along the right leg, more prominent distally, with diffuse skin thickening about the level of the lower leg and ankle. 3. No evidence of abscess. 4. Mild scattered vascular calcifications seen. Electronically Signed   By: Roanna Raider M.D.   On: 02/13/2015 02:13    ASSESSMENT/PLAN:  Generalized weakness - for Home health PT, OT and CNA  RLE cellulitis - resolved  Hypertension - continue Norvasc 5 mg daily and Chlorthalidone 25 mg daily   Glaucoma - continue Azopt eye drop  Blindness - fall precaution  Anemia -  hgb re-check 11.2;  stable  Hypokalemia - K 3.5; continue KCl ER 10 meq 1 tab daily  Protein calorie malnutrition - albumin 2.99; continue Procel 1 scoop BID      I have filled out patient's discharge paperwork and written prescriptions.  Patient will receive home health PT, OT and CNA.  Total discharge time: Greater than 30 minutes  Discharge time involved coordination of the discharge process with social worker, nursing staff and therapy department. Medical justification for home health services verified.     [facility], NP [employer] [phone]

## 2017-05-15 IMAGING — CR DG TIBIA/FIBULA 2V*R*
2 series · 2 of 2 positions shown · non-contrast
Comparison: None.

CLINICAL DATA: [AGE] female with skin infection and concern
for osteomyelitis.

EXAM:
RIGHT TIBIA AND FIBULA - 2 VIEW

[x tib-fib ap right]
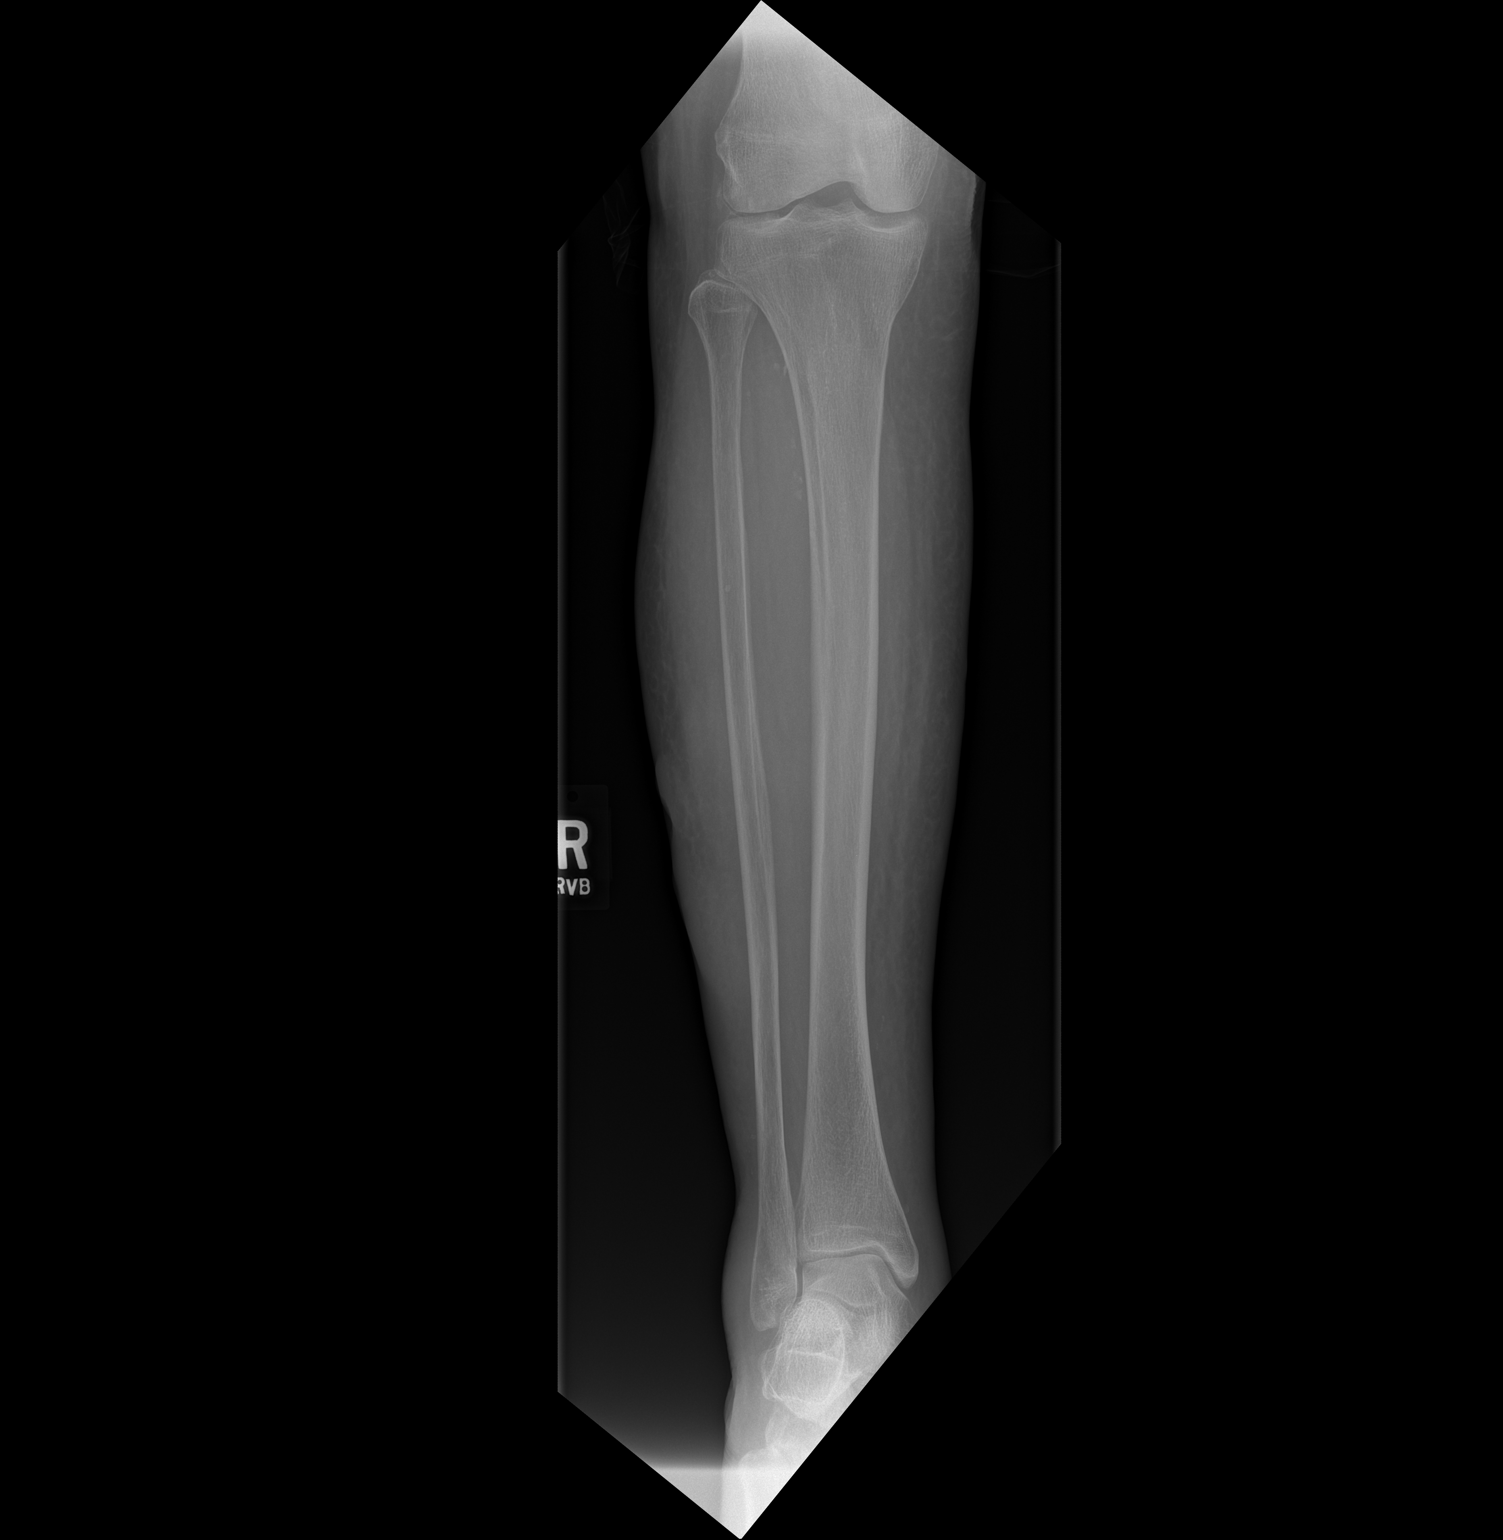

[x tib-fib lat right]
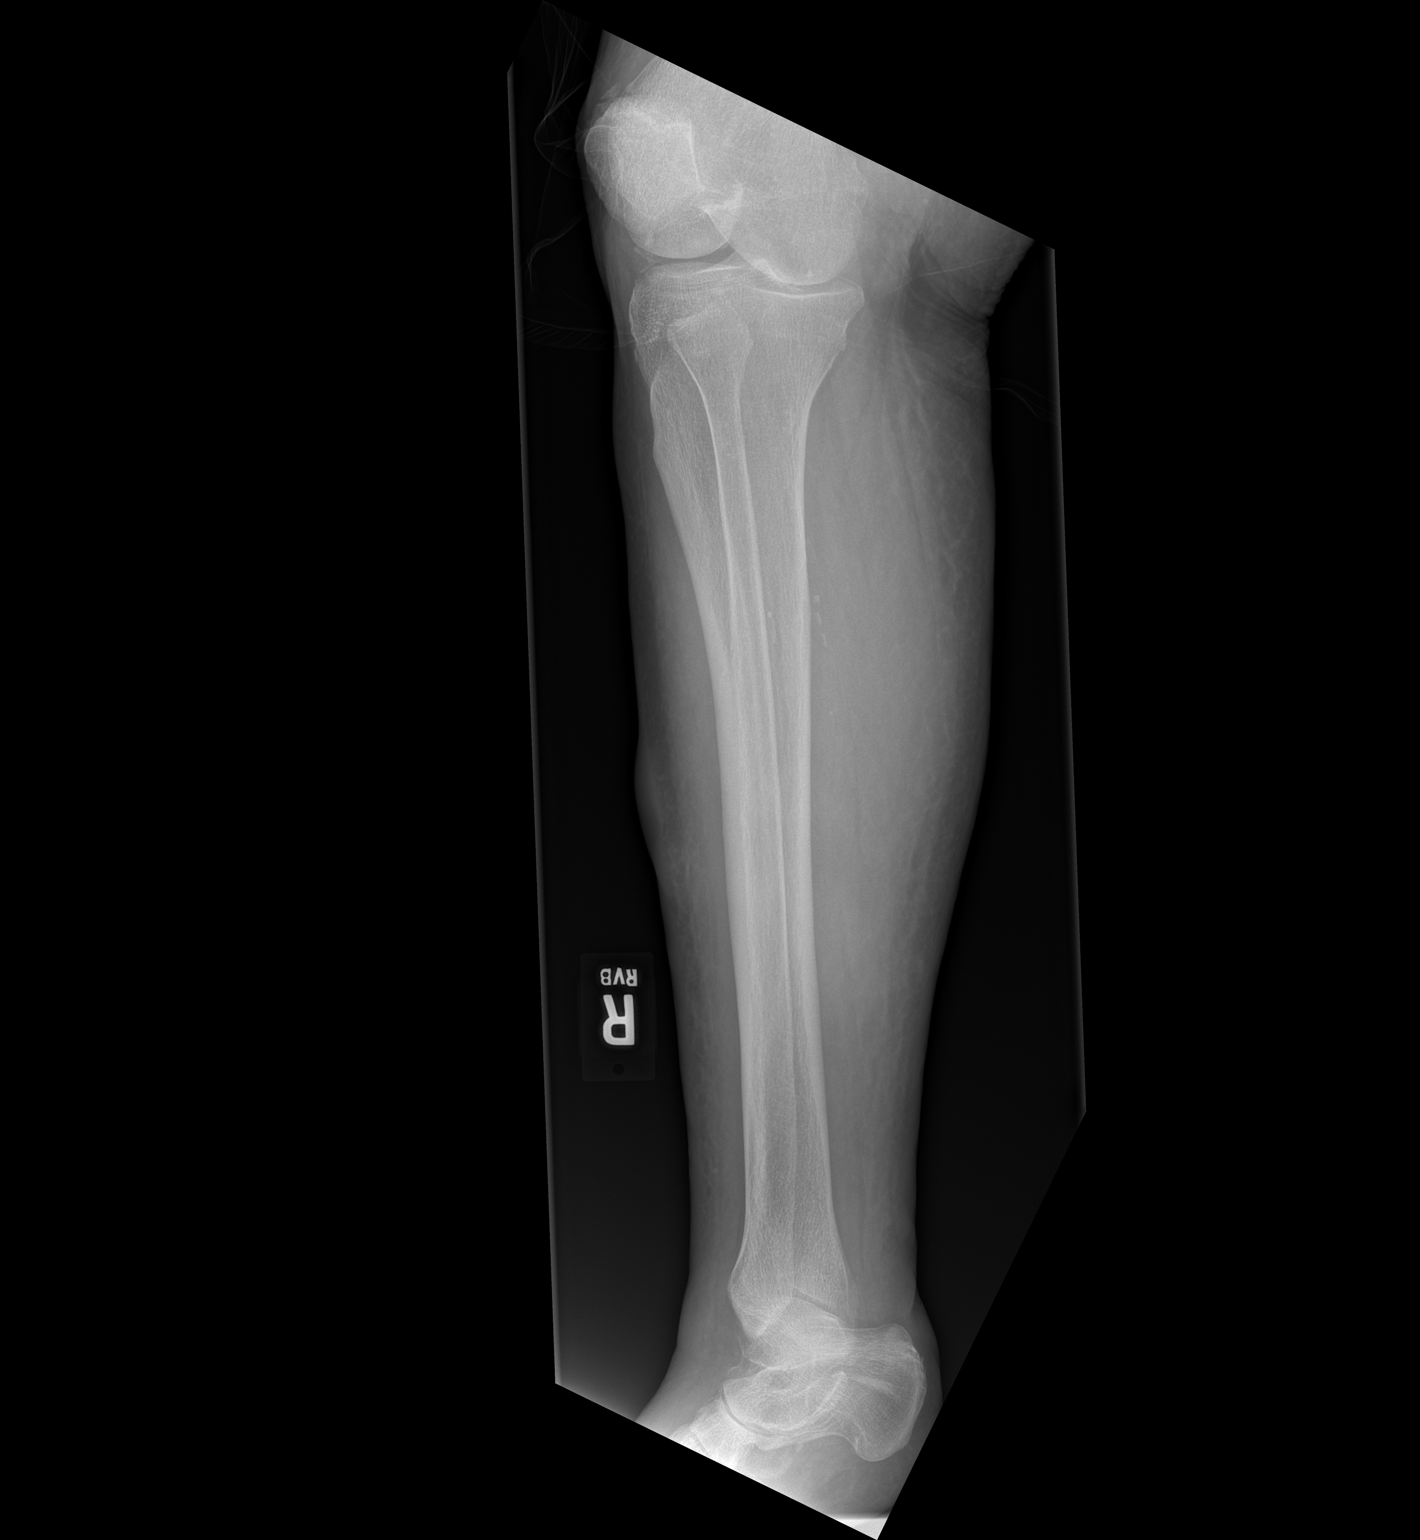

[2 of 2 positions shown; findings below may reference images not displayed]

FINDINGS: There is no acute fracture or dislocation. Mild osteopenia. There is
no bone erosion or periosteal reaction. There is mild diffuse
subcutaneous soft tissue stranding likely cellulitis. A focal area
of skin thickening and irregularity noted at the anterior lateral
aspect of the mid portion of the leg, likely the area of focal skin
infection and wound. No soft tissue gas identified.
IMPRESSION: Soft tissue swelling without definite evidence of acute osseous
pathology. MRI or white blood cells scintigraphy may provide better
evaluation if there is high clinical concern for osteomyelitis.

## 2017-05-15 IMAGING — CT CT TIBIA FIBULA *R* W/ CM
5 of 7 series · 14 of 33 positions shown, 15 images · IV contrast (agent unspecified)
Comparison: Right tibia/fibula radiographs performed earlier today
at [DATE] a.m.

CONTRAST:  100mL OMNIPAQUE IOHEXOL 300 MG/ML  SOLN

CLINICAL DATA: Swelling and discoloration along the right lower
leg, of indeterminate age. Initial encounter.

EXAM:
CT OF THE LOWER RIGHT EXTREMITY WITH CONTRAST
TECHNIQUE: Multidetector CT imaging of the right tibia and fibula was performed
according to the standard protocol following intravenous contrast
administration.

[Series 3: knee bone windows · axial · 0.29mm/px · z∈[+470,+604]mm · 2 of 201 slices shown (1 of 2)]
[im 67/201  bone]
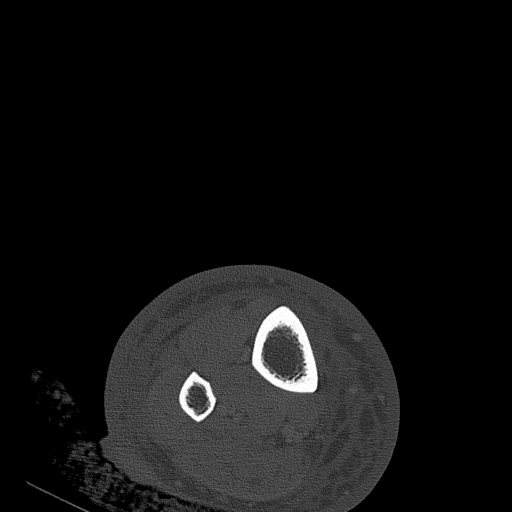
[im 134/201  bone]
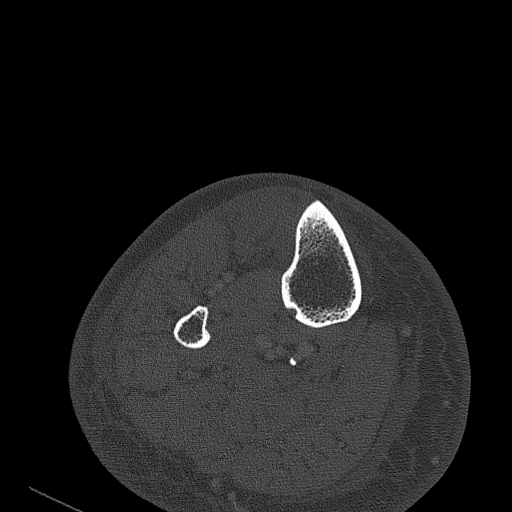

[Series 4: knee st · axial · 0.29mm/px · z∈[+438,+638]mm · 3 of 201 slices shown, 4 images]
[im 51/201  soft-tissue]
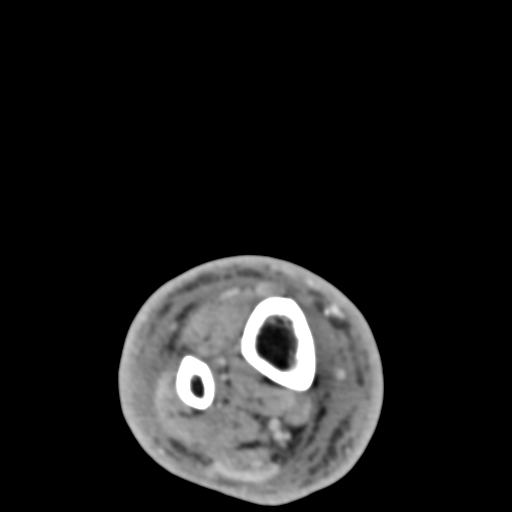
[im 51/201  bone]
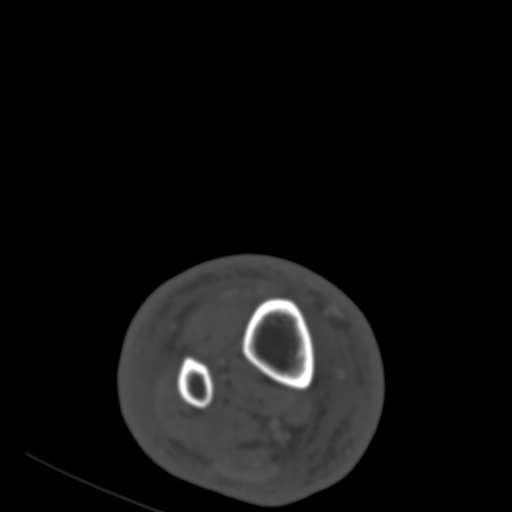
[im 101/201  bone]
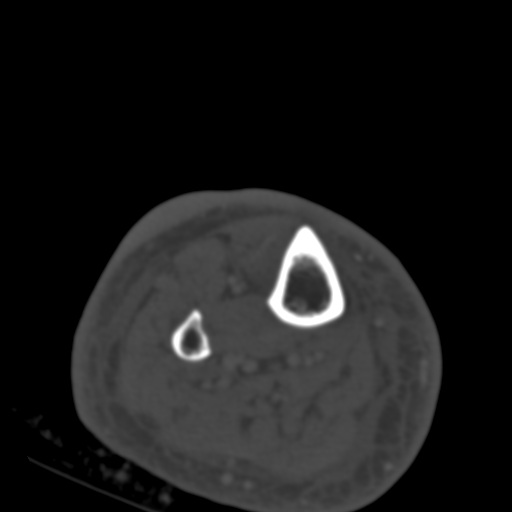
[im 151/201  bone]
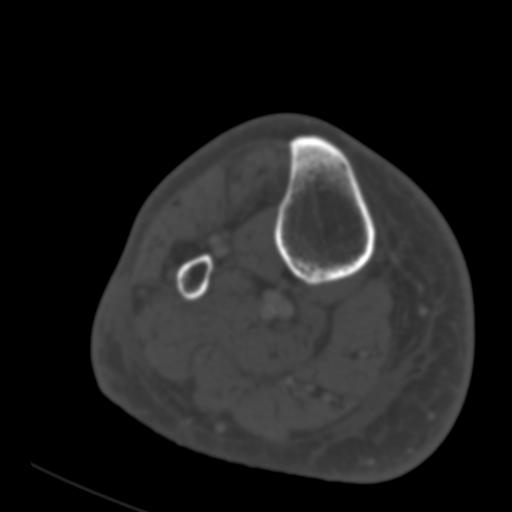

[Series 5: knee bone windows · axial · 0.29mm/px · z∈[+436,+632]mm · 3 of 197 slices shown (2 of 2)]
[im 50/197  bone]
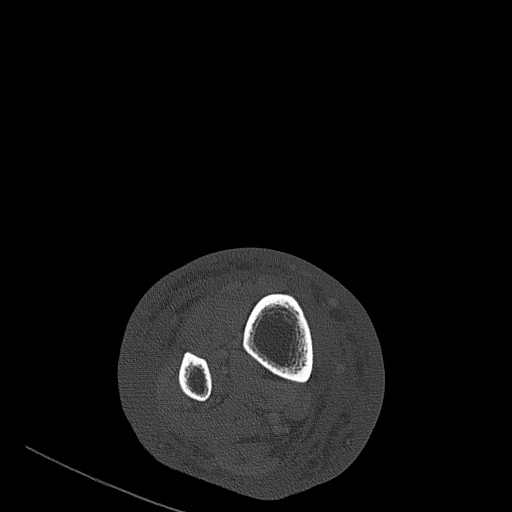
[im 99/197  bone]
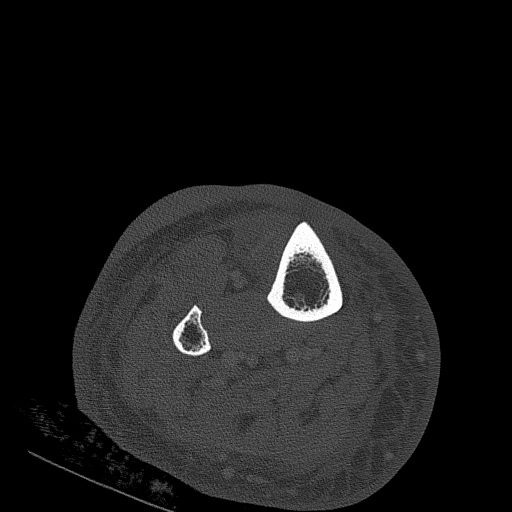
[im 148/197  bone]
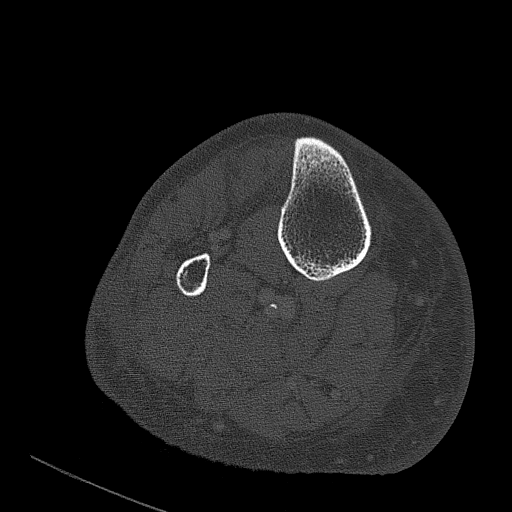

[Series 602: <mpr thick range> · coronal · 0.78mm/px · 1 of 60 slices shown]
[im 30/60  bone]
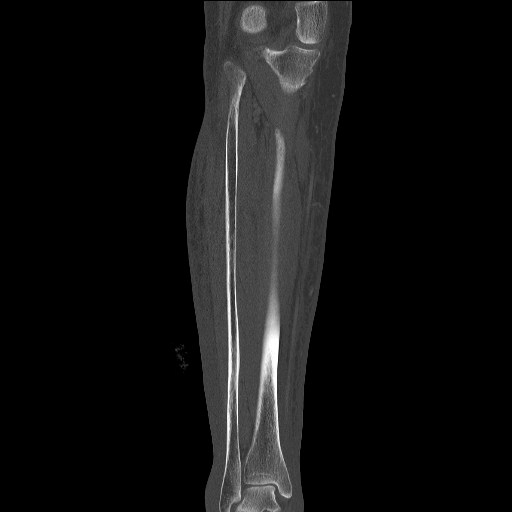

[Series 605: <mpr thick range(3)> · sagittal · 0.78mm/px · 5 of 59 slices shown]
[im 10/59  bone]
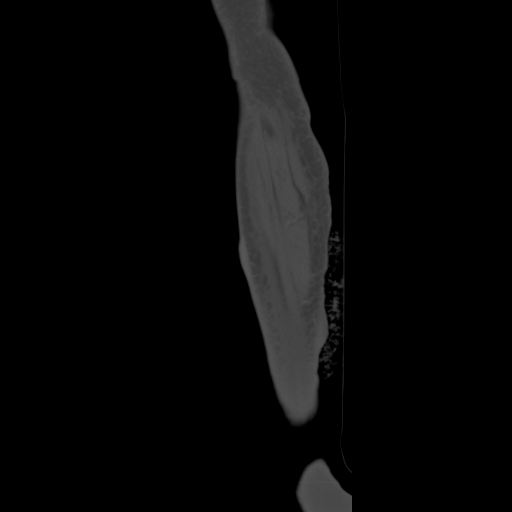
[im 20/59  bone]
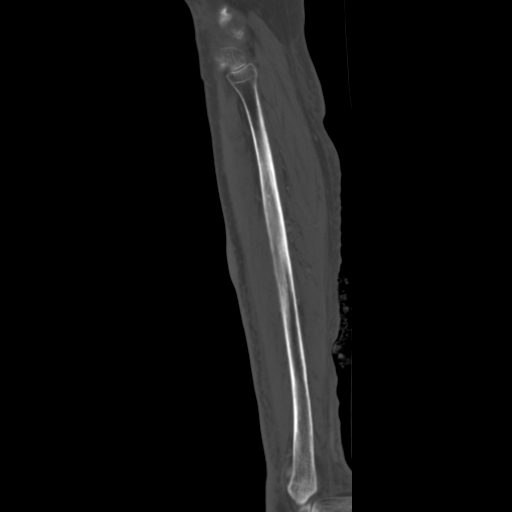
[im 30/59  bone]
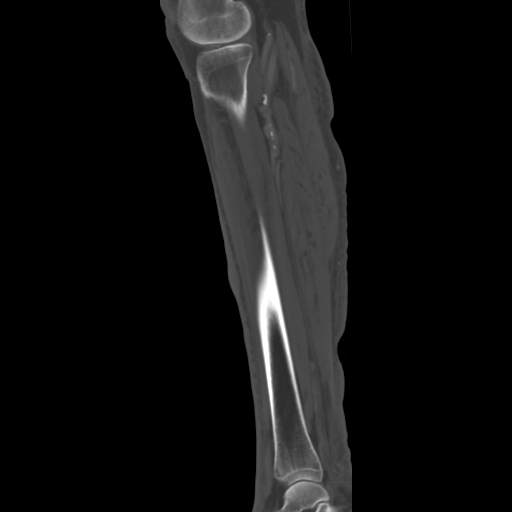
[im 39/59  bone]
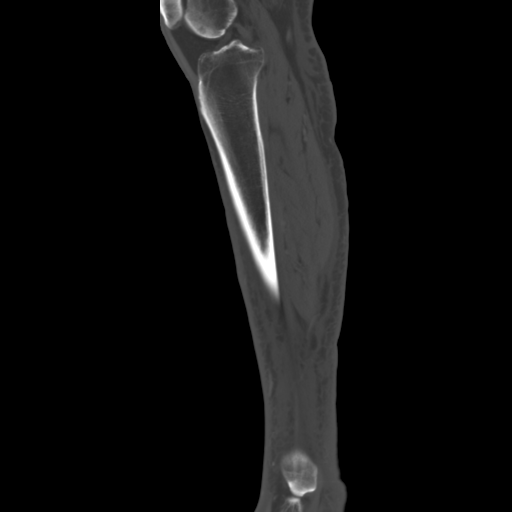
[im 49/59  bone]
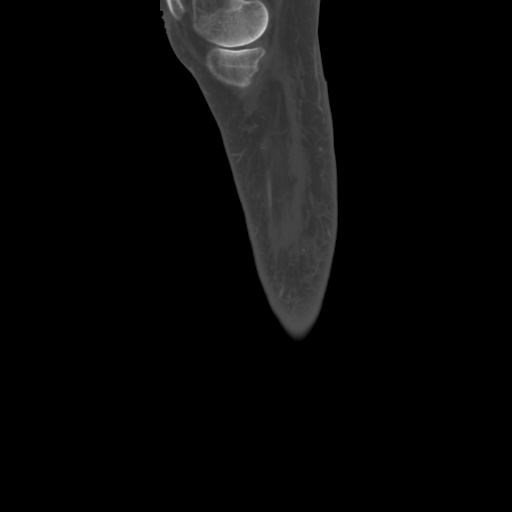

[14 of 33 positions shown; findings below may reference images not displayed]

FINDINGS: The tibia and fibula appear intact. There is no evidence of fracture
or dislocation. Mild diffuse soft tissue edema is noted tracking
along the right leg, more prominent distally, with diffuse skin
thickening noted about the level of the lower leg and ankle. The
underlying musculature is grossly unremarkable. The visualized
vasculature appears intact, aside from scattered calcification along
the popliteal artery and its branches.

There is no evidence of abscess.

No knee joint effusion is identified. Visualized soft tissue
structures about the knee are grossly unremarkable.
IMPRESSION: 1. No evidence of fracture or dislocation.
2. Mild diffuse soft tissue edema tracks along the right leg, more
prominent distally, with diffuse skin thickening about the level of
the lower leg and ankle.
3. No evidence of abscess.
4. Mild scattered vascular calcifications seen.

## 2018-05-28 DEATH — deceased
